# Patient Record
Sex: Male | Born: 1985 | Race: Asian | Hispanic: No | Marital: Single | Smoking: Former smoker
Health system: Southern US, Community
[De-identification: ages and names within clinical notes are randomized; demographics above are authoritative.]

## PROBLEM LIST (undated history)

## (undated) DIAGNOSIS — I499 Cardiac arrhythmia, unspecified: Secondary | ICD-10-CM

## (undated) DIAGNOSIS — K219 Gastro-esophageal reflux disease without esophagitis: Secondary | ICD-10-CM

## (undated) DIAGNOSIS — F419 Anxiety disorder, unspecified: Secondary | ICD-10-CM

## (undated) DIAGNOSIS — T7840XA Allergy, unspecified, initial encounter: Secondary | ICD-10-CM

## (undated) HISTORY — DX: Allergy, unspecified, initial encounter: T78.40XA

## (undated) HISTORY — PX: APPENDECTOMY: SHX54

## (undated) HISTORY — DX: Cardiac arrhythmia, unspecified: I49.9

## (undated) HISTORY — DX: Anxiety disorder, unspecified: F41.9

## (undated) HISTORY — DX: Gastro-esophageal reflux disease without esophagitis: K21.9

## (undated) HISTORY — PX: MANDIBLE FRACTURE SURGERY: SHX706

## (undated) HISTORY — PX: EYE SURGERY: SHX253

---

## 2018-02-02 ENCOUNTER — Encounter: Payer: Self-pay | Admitting: Cardiology

## 2018-12-01 ENCOUNTER — Ambulatory Visit: Payer: Self-pay | Admitting: Nurse Practitioner

## 2018-12-09 ENCOUNTER — Other Ambulatory Visit: Payer: Self-pay | Admitting: *Deleted

## 2018-12-09 DIAGNOSIS — Z20822 Contact with and (suspected) exposure to covid-19: Secondary | ICD-10-CM

## 2018-12-14 ENCOUNTER — Telehealth: Payer: Self-pay | Admitting: General Practice

## 2018-12-14 LAB — NOVEL CORONAVIRUS, NAA: SARS-CoV-2, NAA: NOT DETECTED

## 2018-12-14 NOTE — Telephone Encounter (Signed)
Pt called in to request his results. Advised pt of negative results.

## 2018-12-23 ENCOUNTER — Ambulatory Visit: Payer: Self-pay | Admitting: Nurse Practitioner

## 2018-12-24 ENCOUNTER — Ambulatory Visit (INDEPENDENT_AMBULATORY_CARE_PROVIDER_SITE_OTHER): Payer: Commercial Managed Care - PPO | Admitting: Nurse Practitioner

## 2018-12-24 ENCOUNTER — Other Ambulatory Visit: Payer: Self-pay

## 2018-12-24 ENCOUNTER — Encounter: Payer: Self-pay | Admitting: Nurse Practitioner

## 2018-12-24 VITALS — BP 111/74 | HR 44 | Temp 97.9°F | Ht 71.0 in | Wt 136.0 lb

## 2018-12-24 DIAGNOSIS — K580 Irritable bowel syndrome with diarrhea: Secondary | ICD-10-CM | POA: Diagnosis not present

## 2018-12-24 DIAGNOSIS — F5101 Primary insomnia: Secondary | ICD-10-CM | POA: Diagnosis not present

## 2018-12-24 DIAGNOSIS — L659 Nonscarring hair loss, unspecified: Secondary | ICD-10-CM

## 2018-12-24 DIAGNOSIS — K219 Gastro-esophageal reflux disease without esophagitis: Secondary | ICD-10-CM | POA: Insufficient documentation

## 2018-12-24 DIAGNOSIS — H9211 Otorrhea, right ear: Secondary | ICD-10-CM | POA: Insufficient documentation

## 2018-12-24 DIAGNOSIS — F411 Generalized anxiety disorder: Secondary | ICD-10-CM | POA: Diagnosis not present

## 2018-12-24 DIAGNOSIS — K589 Irritable bowel syndrome without diarrhea: Secondary | ICD-10-CM | POA: Insufficient documentation

## 2018-12-24 DIAGNOSIS — G4700 Insomnia, unspecified: Secondary | ICD-10-CM | POA: Insufficient documentation

## 2018-12-24 MED ORDER — CEFDINIR 300 MG PO CAPS: 300.0000 mg | ORAL_CAPSULE | Freq: Two times a day (BID) | ORAL | 0 refills | Status: AC

## 2018-12-24 MED ORDER — BUSPIRONE HCL 5 MG PO TABS: 5.0000 mg | ORAL_TABLET | Freq: Two times a day (BID) | ORAL | 3 refills | Status: DC | PRN

## 2018-12-24 MED ORDER — FINASTERIDE 5 MG PO TABS: 5.0000 mg | ORAL_TABLET | Freq: Every day | ORAL | 2 refills | Status: DC

## 2018-12-24 MED ORDER — DOXEPIN HCL 3 MG PO TABS: 6.0000 mg | ORAL_TABLET | Freq: Every day | ORAL | 2 refills | Status: DC

## 2018-12-24 NOTE — Assessment & Plan Note (Signed)
Ongoing, treated at this time with Finasteride.  Continue this regimen, refills sent.  Return in 4 weeks for physical and will obtain baseline labs.

## 2018-12-24 NOTE — Assessment & Plan Note (Signed)
Diagnosed in [place] and takes medication from there. Will bring to next visit.  Is aware we may need to use alternate.  Only takes medication as needed.  Declines GI referral at this time.  Labs at physical in 4 weeks, check thyroid.

## 2018-12-24 NOTE — Assessment & Plan Note (Signed)
Ongoing and was prescribed a "nerve pill" in [place]. Will trial Buspar as needed.  Is not interested in daily SSRI.  Return in 4 weeks for follow-up.

## 2018-12-24 NOTE — Assessment & Plan Note (Signed)
Currently using a medication he received in [place], uses as needed.  Is aware for medication in office will need to switch to alternate.  Not interested in GI referral at this time for his IBS and GERD.  Labs in 4 weeks.

## 2018-12-24 NOTE — Progress Notes (Signed)
New Patient Office Visit  Subjective:  Patient ID: Carl Copeland, male    DOB: Feb 08, 1986  Age: 33 y.o. MRN: [id number]  CC:  Chief Complaint  Patient presents with  . Establish Care  . Medication Refill   . This visit was completed via Doximity due to the restrictions of the COVID-19 pandemic. All issues as above were discussed and addressed. Physical exam was done as above through visual confirmation on Doximity. If it was felt that the patient should be evaluated in the office, they were directed there. The patient verbally consented to this visit. . Location of the patient: home . Location of the provider: home . Those involved with this call:  . Provider: Marnee Guarneri, DNP . CMA: Yvonna Alanis, CMA . Front Desk/Registration: Jill Side  . Time spent on call: 30 minutes with patient face to face via video conference. More than 50% of this time was spent in counseling and coordination of care. 10 minutes total spent in review of patient's record and preparation of their chart.  . I verified patient identity using two factors (patient name and date of birth). Patient consents verbally to being seen via telemedicine visit today.    HPI Carl Copeland presents for new patient visit to establish care.  Introduced to Designer, jewellery role and practice setting.  All questions answered.  He is originally from [place] and discussed some medications today he received in [place], I explained to him that some of those are not ones we use here but we can find alternatives.  He reports he has alopecia, takes Finasteride for this, and also history of IBS, insomnia, ear infections, and hyperventilation syndrome (anxiety).  INSOMNIA Takes Doxepin at night sometimes, maybe once or twice a week, for sleep.  When he takes this he takes 6 MG. Duration: chronic Satisfied with sleep quality: yes Difficulty falling asleep: no Difficulty staying asleep: yes Waking a few hours after sleep onset:  sometimes Early morning awakenings: sometimes Daytime hypersomnolence: no Wakes feeling refreshed: yes Good sleep hygiene: yes Apnea: no Snoring: no Depressed/anxious mood: anxious sometimes Recent stress: no Restless legs/nocturnal leg cramps: no Chronic pain/arthritis: no History of sleep study: no Treatments attempted: melatonin   EAR PAIN Diagnosed with ear infection in March.  Was treated with Cefdinir 300 MG.  Reports he is feeling like ear has infection again and wishes to have Cefdinir again, as this helped last time and he had no side effects with this.  Reports he can not take penicillin, but not sure why.  States he has had some drainage from ear and mild pressure. Duration: days Involved ear(s): right Severity:  mild Quality:  none Fever: no Otorrhea: yes Upper respiratory infection symptoms: no Pruritus: no Hearing loss: no Water immersion no Using Q-tips: yes Recurrent otitis media: yes Status: fluctuating Treatments attempted: none   GERD In [place] had been told he has heart burn and IBS  Reports he throws up sometimes and has diarrhea.  States he has irritable bowel, with more diarrhea than constipation.  Was given a pill in [place], which he still has some of, that helps this.  States he does not like to take pills, so he only takes pills that are as needed.  Does not like taking daily pills.  At this time he reports his IBS and GERD are under control.  He has never had colonoscopy or endoscopy.  At this time he reports regular bowel pattern with no diarrhea in past few weeks.  Denies  N&V, fever, constipation, or blood in stool. GERD control status: stable  Satisfied with current treatment? yes Heartburn frequency:  Medication side effects: no  Previous GERD medications: medication from [place] Antacid use frequency:  none Nature: burning Location: epigastric Heartburn duration: 30 minutes Alleviatiating factors: the medication he got in [place] Aggravating  factors: some meals Dysphagia: no Odynophagia:  no Hematemesis: no Blood in stool: no EGD: no   ALOPECIA: Started in his 20's.  Loss of hair to scalp and eyebrows reported.  He takes Finasteride daily for this.  Reports this has slowed down and helped hair loss.  Does not recall having thyroid ever checked.  Does endorses some occasional cold intolerance, fatigue, and anxiety + bowel issues.  Denies weight changes, palpitations, or edema.  ANXIETY/STRESS Saw a psychologist in [place] who diagnosed him with "hyperventilation syndrome".  States they gave him a pill to help the nerves.  Pill bottle states "CLP, G.M., ALMDC".  Discussed with him that certain medications provided in [place] are not provided in the [place].  Educated on medications available, he does not want to take a daily medication such as SSRI, is willing to try Buspar as needed.  Reports he at times gets to breathing fast and that is when he would take medication. Duration:stable Anxious mood: yes  Excessive worrying: no Irritability: sometimes  Sweating: no Nausea: no Palpitations:no Hyperventilation: sometimes, better lately Panic attacks: no Agoraphobia: no  Obscessions/compulsions: no Depressed mood: no Anhedonia: no Weight changes: no Insomnia: yes hard to stay asleep  Hypersomnia: no Fatigue/loss of energy: no Feelings of worthlessness: no Feelings of guilt: no Impaired concentration/indecisiveness: sometimes Suicidal ideations: no  Crying spells: no Recent Stressors/Life Changes: no   Relationship problems: no   Family stress: no     Financial stress: no    Job stress: no    Recent death/loss: no  GAD 7 : Generalized Anxiety Score 12/24/2018  Nervous, Anxious, on Edge 1  Control/stop worrying 1  Worry too much - different things 1  Trouble relaxing 0  Restless 0  Easily annoyed or irritable 1  Afraid - awful might happen 0  Total GAD 7 Score 4  Anxiety Difficulty Not difficult at all    Past Medical  History:  Diagnosis Date  . Allergy   . Cardiac dysrhythmia     Past Surgical History:  Procedure Laterality Date  . APPENDECTOMY    . EYE SURGERY      Family History  Problem Relation Age of Onset  . Heart disease Paternal Grandmother     Social History   Socioeconomic History  . Marital status: Single    Spouse name: Not on file  . Number of children: Not on file  . Years of education: Not on file  . Highest education level: Not on file  Occupational History  . Not on file  Social Needs  . Financial resource strain: Not hard at all  . Food insecurity    Worry: Never true    Inability: Never true  . Transportation needs    Medical: No    Non-medical: No  Tobacco Use  . Smoking status: Former Games developermoker  . Smokeless tobacco: Never Used  Substance and Sexual Activity  . Alcohol use: Yes    Comment: socially  . Drug use: Never  . Sexual activity: Not Currently  Lifestyle  . Physical activity    Days per week: 2 days    Minutes per session: 30 min  . Stress: Only a  little  Relationships  . Social Musicianconnections    Talks on phone: Twice a week    Gets together: Twice a week    Attends religious service: Never    Active member of club or organization: No    Attends meetings of clubs or organizations: Never    Relationship status: Not on file  . Intimate partner violence    Fear of current or ex partner: No    Emotionally abused: No    Physically abused: No    Forced sexual activity: No  Other Topics Concern  . Not on file  Social History Narrative  . Not on file    ROS Review of Systems  Constitutional: Negative for activity change, diaphoresis, fatigue and fever.  HENT: Positive for ear discharge. Negative for congestion, ear pain, postnasal drip, rhinorrhea, sinus pressure, sinus pain, sneezing, sore throat and voice change.   Respiratory: Negative for cough, chest tightness, shortness of breath and wheezing.   Cardiovascular: Negative for chest pain,  palpitations and leg swelling.  Gastrointestinal: Negative for abdominal distention, abdominal pain, constipation, diarrhea, nausea and vomiting.  Endocrine: Negative for cold intolerance and heat intolerance.  Musculoskeletal: Negative.   Skin: Negative.   Neurological: Negative for dizziness, syncope, weakness, light-headedness, numbness and headaches.  Psychiatric/Behavioral: Positive for sleep disturbance. Negative for decreased concentration, self-injury and suicidal ideas. The patient is nervous/anxious.     Objective:   Today's Vitals: BP 111/74   Pulse (!) 44   Temp 97.9 F (36.6 C) (Oral)   Ht 5\' 11"  (1.803 m)   Wt 136 lb (61.7 kg)   SpO2 99%   BMI 18.97 kg/m   Physical Exam Vitals signs and nursing note reviewed.  Constitutional:      General: He is awake. He is not in acute distress.    Appearance: He is well-developed. He is not ill-appearing.  HENT:     Head: Normocephalic.     Comments: Patchy hair pattern to scalp and brows.    Right Ear: Hearing and external ear normal. No drainage.     Left Ear: Hearing and external ear normal. No drainage.     Ears:     Comments: Unable to view inside ear due to virtual visit only.  External ear with no drainage noted and patient denies tenderness to external ear.      Nose: Nose normal.     Mouth/Throat:     Mouth: Mucous membranes are moist.     Pharynx: Oropharynx is clear.     Comments: Viewed via virtual, no erythema noted. Eyes:     General: Lids are normal.        Right eye: No discharge.        Left eye: No discharge.     Conjunctiva/sclera: Conjunctivae normal.  Neck:     Musculoskeletal: Normal range of motion.  Cardiovascular:     Comments: Unable to auscultate due to virtual exam only Pulmonary:     Effort: Pulmonary effort is normal. No accessory muscle usage or respiratory distress.     Comments: Unable to auscultate due to virtual exam only Abdominal:     Tenderness: There is no abdominal tenderness.      Comments: Unable to auscultate due to virtual exam only.  He denies tenderness to abdomen on self palpation.   Neurological:     Mental Status: He is alert and oriented to person, place, and time.  Psychiatric:        Mood and Affect:  Mood normal.        Behavior: Behavior normal. Behavior is cooperative.        Thought Content: Thought content normal.        Judgment: Judgment normal.     Assessment & Plan:   Problem List Items Addressed This Visit      Digestive   Acid reflux    Currently using a medication he received in [place], uses as needed.  Is aware for medication in office will need to switch to alternate.  Not interested in GI referral at this time for his IBS and GERD.  Labs in 4 weeks.      IBS (irritable bowel syndrome)    Diagnosed in [place] and takes medication from there. Will bring to next visit.  Is aware we may need to use alternate.  Only takes medication as needed.  Declines GI referral at this time.  Labs at physical in 4 weeks, check thyroid.         Nervous and Auditory   Drainage from right ear    Acute, mild discomfort reported.  Previously treated successfully with Cefdinir per patient report.  Script sent.  Recommend not using Qtips and may take Tylenol for discomfort if present.  Return to office for worsening or ongoing issues.        Musculoskeletal and Integument   Alopecia - Primary    Ongoing, treated at this time with Finasteride.  Continue this regimen, refills sent.  Return in 4 weeks for physical and will obtain baseline labs.        Other   Insomnia    Chronic, currently treated with Doxepin. Will continue this treatment, at this time he only uses as needed.  Refill sent for 3 MG tablets (he sometimes takes one pill and other times takes two).        Generalized anxiety disorder    Ongoing and was prescribed a "nerve pill" in [place]. Will trial Buspar as needed.  Is not interested in daily SSRI.  Return in 4 weeks for follow-up.       Relevant Medications   busPIRone (BUSPAR) 5 MG tablet      Outpatient Encounter Medications as of 12/24/2018  Medication Sig  . finasteride (PROSCAR) 5 MG tablet Take 1 tablet (5 mg total) by mouth daily.  . [DISCONTINUED] Doxepin HCl 6 MG TABS   . [DISCONTINUED] finasteride (PROSCAR) 5 MG tablet   . busPIRone (BUSPAR) 5 MG tablet Take 1 tablet (5 mg total) by mouth 2 (two) times daily as needed (for anxiety or hyperventilation).  . cefdinir (OMNICEF) 300 MG capsule Take 1 capsule (300 mg total) by mouth 2 (two) times daily for 7 days.  . Doxepin HCl 3 MG TABS Take 2 tablets (6 mg total) by mouth at bedtime.  . [DISCONTINUED] Doxepin HCl 3 MG TABS    No facility-administered encounter medications on file as of 12/24/2018.    I discussed the assessment and treatment plan with the patient. The patient was provided an opportunity to ask questions and all were answered. The patient agreed with the plan and demonstrated an understanding of the instructions.   The patient was advised to call back or seek an in-person evaluation if the symptoms worsen or if the condition fails to improve as anticipated.   I provided 30 minutes of time during this encounter.  Follow-up: Return in about 4 weeks (around 01/21/2019) for Annual physical with labs.   Marjie SkiffJOLENE T CANNADY, NP

## 2018-12-24 NOTE — Patient Instructions (Signed)

## 2018-12-24 NOTE — Assessment & Plan Note (Signed)
Chronic, currently treated with Doxepin. Will continue this treatment, at this time he only uses as needed.  Refill sent for 3 MG tablets (he sometimes takes one pill and other times takes two).

## 2018-12-24 NOTE — Assessment & Plan Note (Signed)
Acute, mild discomfort reported.  Previously treated successfully with Cefdinir per patient report.  Script sent.  Recommend not using Qtips and may take Tylenol for discomfort if present.  Return to office for worsening or ongoing issues.

## 2019-01-15 ENCOUNTER — Other Ambulatory Visit: Payer: Self-pay | Admitting: Nurse Practitioner

## 2019-01-17 ENCOUNTER — Ambulatory Visit: Payer: Commercial Managed Care - PPO | Admitting: Nurse Practitioner

## 2019-01-17 NOTE — Telephone Encounter (Signed)
Requested Prescriptions  Pending Prescriptions Disp Refills  . busPIRone (BUSPAR) 5 MG tablet [Pharmacy Med Name: BUSPIRONE HCL 5 MG TABLET] 180 tablet 0    Sig: TAKE 1 TABLET (5 MG TOTAL) BY MOUTH 2 (TWO) TIMES DAILY AS NEEDED (FOR ANXIETY OR HYPERVENTILATION).     Psychiatry: Anxiolytics/Hypnotics - Non-controlled Passed - 01/15/2019  9:33 AM      Passed - Valid encounter within last 6 months    Recent Outpatient Visits          3 weeks ago La Ward, Barbaraann Faster, NP      Future Appointments            In 1 month Cannady, Barbaraann Faster, NP [facility]

## 2019-02-07 ENCOUNTER — Encounter: Payer: Self-pay | Admitting: Nurse Practitioner

## 2019-02-23 ENCOUNTER — Encounter: Payer: Self-pay | Admitting: Nurse Practitioner

## 2019-02-23 ENCOUNTER — Other Ambulatory Visit: Payer: Self-pay

## 2019-02-23 ENCOUNTER — Ambulatory Visit (INDEPENDENT_AMBULATORY_CARE_PROVIDER_SITE_OTHER): Payer: Commercial Managed Care - PPO | Admitting: Nurse Practitioner

## 2019-02-23 VITALS — BP 98/63 | HR 64 | Temp 97.6°F | Ht 71.0 in | Wt 136.0 lb

## 2019-02-23 DIAGNOSIS — F411 Generalized anxiety disorder: Secondary | ICD-10-CM | POA: Diagnosis not present

## 2019-02-23 DIAGNOSIS — Z114 Encounter for screening for human immunodeficiency virus [HIV]: Secondary | ICD-10-CM

## 2019-02-23 DIAGNOSIS — K219 Gastro-esophageal reflux disease without esophagitis: Secondary | ICD-10-CM | POA: Diagnosis not present

## 2019-02-23 DIAGNOSIS — Z0000 Encounter for general adult medical examination without abnormal findings: Secondary | ICD-10-CM

## 2019-02-23 DIAGNOSIS — I499 Cardiac arrhythmia, unspecified: Secondary | ICD-10-CM | POA: Diagnosis not present

## 2019-02-23 MED ORDER — BUSPIRONE HCL 5 MG PO TABS: 5.0000 mg | ORAL_TABLET | Freq: Two times a day (BID) | ORAL | 3 refills | Status: DC | PRN

## 2019-02-23 MED ORDER — FINASTERIDE 5 MG PO TABS: 5.0000 mg | ORAL_TABLET | Freq: Every day | ORAL | 3 refills | Status: DC

## 2019-02-23 MED ORDER — OMEPRAZOLE 20 MG PO CPDR: 20.0000 mg | DELAYED_RELEASE_CAPSULE | Freq: Every day | ORAL | 3 refills | Status: DC

## 2019-02-23 MED ORDER — DOXEPIN HCL 3 MG PO TABS: 6.0000 mg | ORAL_TABLET | Freq: Every day | ORAL | 3 refills | Status: DC

## 2019-02-23 NOTE — Assessment & Plan Note (Signed)
Diagnosed in [place], abnormal EKG today.  He is asymptomatic.  Have recommended he take Metoprolol consistently.  Referral placed to cardiology for further evaluation and recommendations due to past history, family history, and current EKG.  Baseline labs today, including TSH, CBC, CMP.

## 2019-02-23 NOTE — Patient Instructions (Signed)

## 2019-02-23 NOTE — Assessment & Plan Note (Signed)
Chronic, ongoing.  Tolerating Buspar, will continue this as needed.  Consider SSRI, such as Zoloft, if increased symptoms.  Denies SI/HI

## 2019-02-23 NOTE — Progress Notes (Signed)
BP 98/63   Pulse 64   Temp 97.6 F (36.4 C) (Oral)   Ht 5\' 11"  (1.803 m)   Wt 136 lb (61.7 kg)   SpO2 98%   BMI 18.97 kg/m    Subjective:    Patient ID: Carl Copeland, male    DOB: [birth year], 33 y.o.   MRN: [id number]  HPI: Carl Copeland is a 34 y.o. male presenting on 02/23/2019 for comprehensive medical examination. Current medical complaints include:none  He currently lives with: Interim Problems from his last visit: no   ANXIETY/STRESS Started on Buspar at initial visit for anxiety.  Reported in [place] he had been diagnosed with hyperventilation syndrome and they provided him pills for his nerves.  He does not want to take a daily pill like SSRI, reports he has been doing well with Buspar as needed. Duration:stable Anxious mood: yes  Excessive worrying: no Irritability: no  Sweating: no Nausea: no Palpitations:no Hyperventilation: yes Panic attacks: no Agoraphobia: no  Obscessions/compulsions: no Depressed mood: no Depression screen PHQ 2/9 02/23/2019  Decreased Interest 1  Down, Depressed, Hopeless 1  PHQ - 2 Score 2  Altered sleeping 3  Tired, decreased energy 3  Change in appetite 1  Feeling bad or failure about yourself  2  Trouble concentrating 1  Moving slowly or fidgety/restless 3  Suicidal thoughts 0  PHQ-9 Score 15  Difficult doing work/chores Not difficult at all   Anhedonia: no Weight changes: no Insomnia: yes hard to fall asleep  Hypersomnia: no Fatigue/loss of energy: no Feelings of worthlessness: no Feelings of guilt: no Impaired concentration/indecisiveness: yes Suicidal ideations: no  Crying spells: no Recent Stressors/Life Changes: no   Relationship problems: no   Family stress: no     Financial stress: no    Job stress: no    Recent death/loss: no  GAD 7 : Generalized Anxiety Score 02/23/2019 12/24/2018  Nervous, Anxious, on Edge 1 1  Control/stop worrying 1 1  Worry too much - different things 1 1  Trouble relaxing 1 0  Restless 0  0  Easily annoyed or irritable 1 1  Afraid - awful might happen 1 0  Total GAD 7 Score 6 4  Anxiety Difficulty Not difficult at all Not difficult at all   GERD Has tried over the counter medications, Nexium, and baking soda water.  States Nexium did not work, but baking soda water helps sometimes.  Tried a [place] medication, but this is not working.  States he was told by his cardiologist in [place] that his heart burn may come from his arrhythmia. GERD control status: uncontrolled  Satisfied with current treatment? no Heartburn frequency:  Medication side effects: no  Medication compliance: fluctuating Previous GERD medications: Antacid use frequency:   Duration:  Nature:  Location:  Heartburn duration:  Alleviatiating factors:   Aggravating factors:  Dysphagia: no Odynophagia:  no Hematemesis: no Blood in stool: no EGD: no   IRREGULAR HEART BEAT: He states he is taking a medication that was given to him by a provider in [place] for this.  States his mother's side of the family have arrhythmia, but he does not know what kind as does not talk to mother.  He had a full cardiac work-up in [place] and was told his was a "2/5 and I didn't need surgery yet, but would someday".  Reports he was told if any symptoms with "heart beat to come back to see them".  Denies any symptoms or irregular beats today.  States these  happen more when he is having his "hyperventilation syndrome".  He sent picture of medication prescribed and is Metoprolol ER 1 tablet daily (25 MG).  Reports he does not take this every day as he has been trying to reduce his pills daily, takes "maybe every other day".  Denies SOB, CP, N&V, dizziness, syncope, or headaches.  Does report occasional palpitations, once to twice a week.  Functional Status Survey: Is the patient deaf or have difficulty hearing?: No Does the patient have difficulty seeing, even when wearing glasses/contacts?: No Does the patient have difficulty  concentrating, remembering, or making decisions?: No Does the patient have difficulty walking or climbing stairs?: No Does the patient have difficulty dressing or bathing?: No Does the patient have difficulty doing errands alone such as visiting a doctor's office or shopping?: No  FALL RISK: No flowsheet data found.  Depression Screen Depression screen [facility] 2/9 02/23/2019  Decreased Interest 1  Down, Depressed, Hopeless 1  PHQ - 2 Score 2  Altered sleeping 3  Tired, decreased energy 3  Change in appetite 1  Feeling bad or failure about yourself  2  Trouble concentrating 1  Moving slowly or fidgety/restless 3  Suicidal thoughts 0  PHQ-9 Score 15  Difficult doing work/chores Not difficult at all    Advanced Directives <no information>  Past Medical History:  Past Medical History:  Diagnosis Date  . Allergy   . Cardiac dysrhythmia     Surgical History:  Past Surgical History:  Procedure Laterality Date  . APPENDECTOMY    . EYE SURGERY      Medications:  Current Outpatient Medications on File Prior to Visit  Medication Sig  . metoprolol succinate (TOPROL-XL) 25 MG 24 hr tablet Take 25 mg by mouth daily.   No current facility-administered medications on file prior to visit.     Allergies:  Allergies  Allergen Reactions  . Other     Cherries, pollen, peanuts    . Penicillins Rash    diarrhea    Social History:  Social History   Socioeconomic History  . Marital status: Single    Spouse name: Not on file  . Number of children: Not on file  . Years of education: Not on file  . Highest education level: Not on file  Occupational History  . Not on file  Social Needs  . Financial resource strain: Not hard at all  . Food insecurity    Worry: Never true    Inability: Never true  . Transportation needs    Medical: No    Non-medical: No  Tobacco Use  . Smoking status: Former Research scientist (life sciences)  . Smokeless tobacco: Never Used  Substance and Sexual Activity  . Alcohol  use: Yes    Comment: socially  . Drug use: Never  . Sexual activity: Not Currently  Lifestyle  . Physical activity    Days per week: 2 days    Minutes per session: 30 min  . Stress: Only a little  Relationships  . Social Herbalist on phone: Twice a week    Gets together: Twice a week    Attends religious service: Never    Active member of club or organization: No    Attends meetings of clubs or organizations: Never    Relationship status: Not on file  . Intimate partner violence    Fear of current or ex partner: No    Emotionally abused: No    Physically abused: No  Forced sexual activity: No  Other Topics Concern  . Not on file  Social History Narrative  . Not on file   Social History   Tobacco Use  Smoking Status Former Smoker  Smokeless Tobacco Never Used   Social History   Substance and Sexual Activity  Alcohol Use Yes   Comment: socially    Family History:  Family History  Problem Relation Age of Onset  . Heart disease Paternal Grandmother     Past medical history, surgical history, medications, allergies, family history and social history reviewed with patient today and changes made to appropriate areas of the chart.   Review of Systems - negative All other ROS negative except what is listed above and in the HPI.      Objective:    BP 98/63   Pulse 64   Temp 97.6 F (36.4 C) (Oral)   Ht 5\' 11"  (1.803 m)   Wt 136 lb (61.7 kg)   SpO2 98%   BMI 18.97 kg/m   Wt Readings from Last 3 Encounters:  02/23/19 136 lb (61.7 kg)  12/24/18 136 lb (61.7 kg)    Physical Exam Vitals signs and nursing note reviewed.  Constitutional:      General: He is awake. He is not in acute distress.    Appearance: He is well-developed. He is not ill-appearing.  HENT:     Head: Normocephalic and atraumatic.     Right Ear: Hearing, tympanic membrane, ear canal and external ear normal. No drainage.     Left Ear: Hearing, tympanic membrane, ear canal and  external ear normal. No drainage.     Nose: Nose normal.     Mouth/Throat:     Mouth: Mucous membranes are moist.     Pharynx: Uvula midline.  Eyes:     General: Lids are normal.        Right eye: No discharge.        Left eye: No discharge.     Extraocular Movements: Extraocular movements intact.     Conjunctiva/sclera: Conjunctivae normal.     Pupils: Pupils are equal, round, and reactive to light.     Visual Fields: Right eye visual fields normal and left eye visual fields normal.  Neck:     Musculoskeletal: Normal range of motion and neck supple.     Thyroid: No thyromegaly.     Vascular: No carotid bruit.     Trachea: Trachea normal.  Cardiovascular:     Rate and Rhythm: Normal rate. Rhythm irregularly irregular.     Heart sounds: Normal heart sounds, S1 normal and S2 normal. No murmur. No gallop.   Pulmonary:     Effort: Pulmonary effort is normal. No accessory muscle usage or respiratory distress.     Breath sounds: Normal breath sounds.  Abdominal:     General: Bowel sounds are normal.     Palpations: Abdomen is soft. There is no hepatomegaly or splenomegaly.     Tenderness: There is no abdominal tenderness.  Musculoskeletal: Normal range of motion.     Right lower leg: No edema.     Left lower leg: No edema.  Lymphadenopathy:     Cervical: No cervical adenopathy.  Skin:    General: Skin is warm and dry.     Capillary Refill: Capillary refill takes less than 2 seconds.     Findings: No rash.  Neurological:     Mental Status: He is alert and oriented to person, place, and time.  Cranial Nerves: Cranial nerves are intact.     Gait: Gait is intact.     Deep Tendon Reflexes: Reflexes are normal and symmetric.     Reflex Scores:      Brachioradialis reflexes are 2+ on the right side and 2+ on the left side.      Patellar reflexes are 2+ on the right side and 2+ on the left side. Psychiatric:        Attention and Perception: Attention normal.        Mood and  Affect: Mood normal.        Speech: Speech normal.        Behavior: Behavior normal. Behavior is cooperative.        Thought Content: Thought content normal.        Judgment: Judgment normal.    EKG in office: irregular rhythm with PACs and RBBB, slight elevation T waves V4 and V6, rate 60  Results for orders placed or performed in visit on 12/09/18  Novel Coronavirus, NAA ([facility])  Result Value Ref Range   SARS-CoV-2, NAA Not Detected Not Detected      Assessment & Plan:   Problem List Items Addressed This Visit      Digestive   Acid reflux    Ongoing, no benefit from OTC medication.  Will trial low dose Prilosec and have return in 4 weeks, discussed that this is short term medication and not meant for long term use.  If worsening or continued symptoms consider GI referral.      Relevant Medications   omeprazole (PRILOSEC) 20 MG capsule   Other Relevant Orders   Comprehensive metabolic panel     Other   Generalized anxiety disorder    Chronic, ongoing.  Tolerating Buspar, will continue this as needed.  Consider SSRI, such as Zoloft, if increased symptoms.  Denies SI/HI      Relevant Medications   busPIRone (BUSPAR) 5 MG tablet   Other Relevant Orders   TSH   Irregular heart beat    Diagnosed in [place], abnormal EKG today.  He is asymptomatic.  Have recommended he take Metoprolol consistently.  Referral placed to cardiology for further evaluation and recommendations due to past history, family history, and current EKG.  Baseline labs today, including TSH, CBC, CMP.      Relevant Orders   CBC with Differential/Platelet   Comprehensive metabolic panel   TSH   EKG 12-Lead (Completed)   Ambulatory referral to Cardiology    Other Visit Diagnoses    Encounter for annual physical exam    -  Primary   Relevant Orders   CBC with Differential/Platelet   Comprehensive metabolic panel   TSH   Lipid Panel w/o Chol/HDL Ratio   Encounter for screening for HIV       Relevant  Orders   HIV Antibody (routine testing w rflx)      LABORATORY TESTING:  Health maintenance labs ordered today as discussed above.   IMMUNIZATIONS:   - Tdap: Tetanus vaccination status reviewed: last tetanus booster within 10 years. - Influenza: Administered today - Pneumovax: Not applicable - Prevnar: Not applicable - Zostavax vaccine: Not applicable  SCREENING: - Colonoscopy: Not applicable  Discussed with patient purpose of the colonoscopy is to detect colon cancer at curable precancerous or early stages   - AAA Screening: Not applicable  -Hearing Test: Not applicable  -Spirometry: Not applicable   PATIENT COUNSELING:    Sexuality: Discussed sexually transmitted diseases, partner selection, use of  condoms, avoidance of unintended pregnancy  and contraceptive alternatives.   Advised to avoid cigarette smoking.  I discussed with the patient that most people either abstain from alcohol or drink within safe limits (<=14/week and <=4 drinks/occasion for males, <=7/weeks and <= 3 drinks/occasion for females) and that the risk for alcohol disorders and other health effects rises proportionally with the number of drinks per week and how often a drinker exceeds daily limits.  Discussed cessation/primary prevention of drug use and availability of treatment for abuse.   Diet: Encouraged to adjust caloric intake to maintain  or achieve ideal body weight, to reduce intake of dietary saturated fat and total fat, to limit sodium intake by avoiding high sodium foods and not adding table salt, and to maintain adequate dietary potassium and calcium preferably from fresh fruits, vegetables, and low-fat dairy products.    stressed the importance of regular exercise  Injury prevention: Discussed safety belts, safety helmets, smoke detector, smoking near bedding or upholstery.   Dental health: Discussed importance of regular tooth brushing, flossing, and dental visits.   Follow up plan: NEXT  PREVENTATIVE PHYSICAL DUE IN 1 YEAR. Return in about 4 weeks (around 03/23/2019) for GERD follow-up.

## 2019-02-23 NOTE — Assessment & Plan Note (Signed)
Ongoing, no benefit from OTC medication.  Will trial low dose Prilosec and have return in 4 weeks, discussed that this is short term medication and not meant for long term use.  If worsening or continued symptoms consider GI referral.

## 2019-02-24 LAB — CBC WITH DIFFERENTIAL/PLATELET
Basophils Absolute: 0 10*3/uL (ref 0.0–0.2)
Basos: 1 %
EOS (ABSOLUTE): 0.5 10*3/uL — ABNORMAL HIGH (ref 0.0–0.4)
Eos: 11 %
Hematocrit: 48.3 % (ref 37.5–51.0)
Hemoglobin: 16.3 g/dL (ref 13.0–17.7)
Immature Grans (Abs): 0 10*3/uL (ref 0.0–0.1)
Immature Granulocytes: 0 %
Lymphocytes Absolute: 1.6 10*3/uL (ref 0.7–3.1)
Lymphs: 39 %
MCH: 31.5 pg (ref 26.6–33.0)
MCHC: 33.7 g/dL (ref 31.5–35.7)
MCV: 93 fL (ref 79–97)
Monocytes Absolute: 0.3 10*3/uL (ref 0.1–0.9)
Monocytes: 7 %
Neutrophils Absolute: 1.8 10*3/uL (ref 1.4–7.0)
Neutrophils: 42 %
Platelets: 132 10*3/uL — ABNORMAL LOW (ref 150–450)
RBC: 5.17 x10E6/uL (ref 4.14–5.80)
RDW: 12.7 % (ref 11.6–15.4)
WBC: 4.2 10*3/uL (ref 3.4–10.8)

## 2019-02-24 LAB — COMPREHENSIVE METABOLIC PANEL
ALT: 35 IU/L (ref 0–44)
AST: 42 IU/L — ABNORMAL HIGH (ref 0–40)
Albumin/Globulin Ratio: 2 (ref 1.2–2.2)
Albumin: 4.5 g/dL (ref 4.0–5.0)
Alkaline Phosphatase: 62 IU/L (ref 39–117)
BUN/Creatinine Ratio: 12 (ref 9–20)
BUN: 9 mg/dL (ref 6–20)
Bilirubin Total: 0.6 mg/dL (ref 0.0–1.2)
CO2: 27 mmol/L (ref 20–29)
Calcium: 9.5 mg/dL (ref 8.7–10.2)
Chloride: 105 mmol/L (ref 96–106)
Creatinine, Ser: 0.76 mg/dL (ref 0.76–1.27)
GFR calc Af Amer: 140 mL/min/{1.73_m2} (ref >59)
GFR calc non Af Amer: 121 mL/min/{1.73_m2} (ref >59)
Globulin, Total: 2.2 g/dL (ref 1.5–4.5)
Glucose: 82 mg/dL (ref 65–99)
Potassium: 5.4 mmol/L — ABNORMAL HIGH (ref 3.5–5.2)
Sodium: 142 mmol/L (ref 134–144)
Total Protein: 6.7 g/dL (ref 6.0–8.5)

## 2019-02-24 LAB — TSH: TSH: 1.19 u[IU]/mL (ref 0.450–4.500)

## 2019-02-24 LAB — LIPID PANEL W/O CHOL/HDL RATIO
Cholesterol, Total: 146 mg/dL (ref 100–199)
HDL: 53 mg/dL (ref >39)
LDL Chol Calc (NIH): 75 mg/dL (ref 0–99)
Triglycerides: 96 mg/dL (ref 0–149)
VLDL Cholesterol Cal: 18 mg/dL (ref 5–40)

## 2019-02-24 LAB — HIV ANTIBODY (ROUTINE TESTING W REFLEX): HIV Screen 4th Generation wRfx: NONREACTIVE

## 2019-02-25 ENCOUNTER — Telehealth: Payer: Self-pay | Admitting: Nurse Practitioner

## 2019-02-25 DIAGNOSIS — D696 Thrombocytopenia, unspecified: Secondary | ICD-10-CM | POA: Insufficient documentation

## 2019-02-25 MED ORDER — FINASTERIDE 5 MG PO TABS: 5.0000 mg | ORAL_TABLET | Freq: Every day | ORAL | 3 refills | Status: DC

## 2019-02-25 MED ORDER — DOXEPIN HCL 3 MG PO TABS: 6.0000 mg | ORAL_TABLET | Freq: Every day | ORAL | 3 refills | Status: DC

## 2019-02-25 NOTE — Telephone Encounter (Signed)
Spoke to patient via telephone and reviewed labs.  Discussed normal potassium level and his is mildly elevated.  He does take 3 different vitamins every day, educated him on cutting back on this.  Tapering down to one vitamin and for now taking every other day . He states one is like a Centrum.  Educated on low platelet count, discussed normal range with him.  He denies any bleeding or bruising.  Recommend he avoid products containing ASA.  Discussed that we would continue to monitor this at visits.

## 2019-02-25 NOTE — Telephone Encounter (Signed)
Copied from [place] [id number]. Topic: General - Inquiry >> Feb 24, 2019 12:13 PM Richardo Priest, NT wrote: Reason for CRM: Patient called in returning call from [facility] in regards to labs and hippa. Please advise. >> Feb 24, 2019  4:41 PM Sheran Luz wrote: Patient calling back requesting to speak with [facility].

## 2019-03-01 ENCOUNTER — Ambulatory Visit (INDEPENDENT_AMBULATORY_CARE_PROVIDER_SITE_OTHER): Payer: Commercial Managed Care - PPO | Admitting: Cardiology

## 2019-03-01 ENCOUNTER — Other Ambulatory Visit: Payer: Self-pay

## 2019-03-01 ENCOUNTER — Encounter: Payer: Self-pay | Admitting: Cardiology

## 2019-03-01 VITALS — BP 90/70 | HR 67 | Temp 97.3°F | Ht 71.0 in | Wt 137.8 lb

## 2019-03-01 DIAGNOSIS — R002 Palpitations: Secondary | ICD-10-CM

## 2019-03-01 MED ORDER — METOPROLOL SUCCINATE ER 25 MG PO TB24: 25.0000 mg | ORAL_TABLET | Freq: Every day | ORAL | 1 refills | Status: DC

## 2019-03-01 NOTE — Progress Notes (Signed)
Cardiology Office Note:    Date:  03/01/2019   ID:  Lynelle Doctor, DOB [birth year], MRN [id number]  PCP:  Venita Lick, NP  Cardiologist:  Kate Sable, MD  Electrophysiologist:  None   Referring MD: Venita Lick, NP   Chief Complaint  Patient presents with  . New Patient (Initial Visit)    Irregular heart beat    History of Present Illness:    Carl Copeland is a 33 y.o. male with a hx of anxiety, irregular heartbeats who presents due to history of irregular heartbeats.  Patient states having irregular heartbeats diagnosed sings when he was a child.  He states having palpitations last occurring about 3 weeks ago lasting for about 6 hours total.  During the first 4 hours of the palpitations, he took p.o. metoprolol XL and symptoms resolved 2 hours later.  He has not had any episodes since.  He was evaluated by cardiology in [place] about 1 year ago.  He underwent exercise treadmill test which did not reveal any signs of ischemia, but wide-complex tachycardia was noted along with frequent PACs.  He was then prescribed metoprolol XL 25 mg to take daily.  Patient states not taking the medication every day as prescribed.  He only takes it when he feels he has symptoms.  He states not wanting to be on medications for long because he takes a lot of water pills.  Is a former smoker smoked for about 7 years.  He denies any other history of heart disease.  He denies syncope, but notes shortness of breath.  He was diagnosed with asthma as a kid.  He was monitored in the hospital back in [place] years ago for about 3 days with heart monitor which was unrevealing for his palpitations.  Past Medical History:  Diagnosis Date  . Allergy   . Anxiety   . Cardiac dysrhythmia   . GERD (gastroesophageal reflux disease)     Past Surgical History:  Procedure Laterality Date  . APPENDECTOMY    . EYE SURGERY    . MANDIBLE FRACTURE SURGERY      Current Medications: Current Meds  Medication Sig   . busPIRone (BUSPAR) 5 MG tablet Take 1 tablet (5 mg total) by mouth 2 (two) times daily as needed (for anxiety or hyperventilation).  . Doxepin HCl 3 MG TABS Take 2 tablets (6 mg total) by mouth at bedtime.  . finasteride (PROSCAR) 5 MG tablet Take 1 tablet (5 mg total) by mouth daily.  Marland Kitchen omeprazole (PRILOSEC) 20 MG capsule Take 1 capsule (20 mg total) by mouth daily.     Allergies:   Other and Penicillins   Social History   Socioeconomic History  . Marital status: Single    Spouse name: Not on file  . Number of children: Not on file  . Years of education: Not on file  . Highest education level: Not on file  Occupational History  . Not on file  Social Needs  . Financial resource strain: Not hard at all  . Food insecurity    Worry: Never true    Inability: Never true  . Transportation needs    Medical: No    Non-medical: No  Tobacco Use  . Smoking status: Former Research scientist (life sciences)  . Smokeless tobacco: Never Used  Substance and Sexual Activity  . Alcohol use: Yes    Comment: socially  . Drug use: Never  . Sexual activity: Not Currently  Lifestyle  . Physical activity    Days  per week: 2 days    Minutes per session: 30 min  . Stress: Only a little  Relationships  . Social Musicianconnections    Talks on phone: Twice a week    Gets together: Twice a week    Attends religious service: Never    Active member of club or organization: No    Attends meetings of clubs or organizations: Never    Relationship status: Not on file  Other Topics Concern  . Not on file  Social History Narrative  . Not on file     Family History: The patient's family history includes Heart disease in his paternal grandmother.  ROS:   Please see the history of present illness.     All other systems reviewed and are negative.  EKGs/Labs/Other Studies Reviewed:    The following studies were reviewed today: Echocardiogram dated 02/02/2018 from prior cardiologist LVEF of 50 to 55%, atria normal size, LV normal  wall thickness, RV normal size and function.  EKG:  EKG is  ordered today.  The ekg ordered today demonstrates sinus rhythm, frequent PACs.  Recent Labs: 02/23/2019: ALT 35; BUN 9; Creatinine, Ser 0.76; Hemoglobin 16.3; Platelets 132; Potassium 5.4; Sodium 142; TSH 1.190  Recent Lipid Panel    Component Value Date/Time   CHOL 146 02/23/2019 0900   TRIG 96 02/23/2019 0900   HDL 53 02/23/2019 0900   LDLCALC 75 02/23/2019 0900    Physical Exam:    VS:  BP 90/70 (BP Location: Right Arm, Patient Position: Sitting, Cuff Size: Normal)   Pulse 67   Temp (!) 97.3 F (36.3 C)   Ht 5\' 11"  (1.803 m)   Wt 137 lb 12 oz (62.5 kg)   SpO2 98%   BMI 19.21 kg/m     Wt Readings from Last 3 Encounters:  03/01/19 137 lb 12 oz (62.5 kg)  02/23/19 136 lb (61.7 kg)  12/24/18 136 lb (61.7 kg)     GEN:  Well nourished, well developed in no acute distress HEENT: Normal NECK: No JVD; No carotid bruits LYMPHATICS: No lymphadenopathy CARDIAC: Irregular beat, tachycardic, no murmurs, rubs, gallops RESPIRATORY:  Clear to auscultation without rales, wheezing or rhonchi  ABDOMEN: Soft, non-tender, non-distended MUSCULOSKELETAL:  No edema; No deformity  SKIN: Warm and dry NEUROLOGIC:  Alert and oriented x 3 PSYCHIATRIC:  Normal affect   ASSESSMENT:   Patient with history of palpitations.  Last occurrence was 3 weeks ago.  And prior occurrence was months before that.  In light of his frequent PACs on ECG he is at increased risk for other atrial arrhythmia such as A. Fib.  Echo and exercise treadmill test reviewed by myself did not reveal any evidence for ischemia or cardiac dysfunction.  Wide-complex tachycardia noted after exercise treadmill test.  1. Palpitations    PLAN:    In order of problems listed above:  1. Counseled patient to continue Toprol-XL 25 mg daily as currently prescribed.  We will follow up with him in 3 months to see if he has recurrent symptoms.  Patient does not want to take  medications for long.  If he has no symptoms while on the medication, we will hold medication and see if symptoms recur.  If symptoms recur we will plan on giving him a monitor with duration depending on the frequency of symptoms.  Follow-up in 3 months.  Total encounter time more than 60 minutes  Greater than 50% was spent in counseling and coordination of care with the patient  including reviewing his prior records, explained to the patient premature atrial contractions, atrial fibrillation, the meaning of his prior tests which showed wide-complex tachycardia.  Medication Adjustments/Labs and Tests Ordered: Current medicines are reviewed at length with the patient today.  Concerns regarding medicines are outlined above.  Orders Placed This Encounter  Procedures  . EKG 12-Lead   Meds ordered this encounter  Medications  . metoprolol succinate (TOPROL-XL) 25 MG 24 hr tablet    Sig: Take 1 tablet (25 mg total) by mouth daily.    Dispense:  90 tablet    Refill:  1    Patient Instructions  Medication Instructions:  - Your physician has recommended you make the following change in your medication:   1) Restart toprol (metorprolol succinate) 25 mg- take 1 tablet by mouth once daily  If you need a refill on your cardiac medications before your next appointment, please call your pharmacy.   Lab work: - none ordered  If you have labs (blood work) drawn today and your tests are completely normal, you will receive your results only by: Marland Kitchen MyChart Message (if you have MyChart) OR . A paper copy in the mail If you have any lab test that is abnormal or we need to change your treatment, we will call you to review the results.  Testing/Procedures: - none ordered  Follow-Up: At [facility], you and your health needs are our priority.  As part of our continuing mission to provide you with exceptional heart care, we have created designated Provider Care Teams.  These Care Teams include your  primary Cardiologist (physician) and Advanced Practice Providers (APPs -  Physician Assistants and Nurse Practitioners) who all work together to provide you with the care you need, when you need it. . in 3 months with Dr. Azucena Cecil  Any Other Special Instructions Will Be Listed Below (If Applicable). - N/A      Signed, Debbe Odea, MD  03/01/2019 4:27 PM    [facility]

## 2019-03-01 NOTE — Patient Instructions (Signed)
Medication Instructions:  - Your physician has recommended you make the following change in your medication:   1) Restart toprol (metorprolol succinate) 25 mg- take 1 tablet by mouth once daily  If you need a refill on your cardiac medications before your next appointment, please call your pharmacy.   Lab work: - none ordered  If you have labs (blood work) drawn today and your tests are completely normal, you will receive your results only by: Marland Kitchen MyChart Message (if you have MyChart) OR . A paper copy in the mail If you have any lab test that is abnormal or we need to change your treatment, we will call you to review the results.  Testing/Procedures: - none ordered  Follow-Up: At [facility], you and your health needs are our priority.  As part of our continuing mission to provide you with exceptional heart care, we have created designated Provider Care Teams.  These Care Teams include your primary Cardiologist (physician) and Advanced Practice Providers (APPs -  Physician Assistants and Nurse Practitioners) who all work together to provide you with the care you need, when you need it. . in 3 months with Dr. Garen Lah  Any Other Special Instructions Will Be Listed Below (If Applicable). - N/A

## 2019-03-08 ENCOUNTER — Telehealth: Payer: Self-pay

## 2019-03-08 NOTE — Telephone Encounter (Signed)
Copied from [place] [id number]. Topic: General - Other >> Mar 07, 2019  4:56 PM Rainey Pines A wrote: Patient would like a callback from nurse In regards to canceling the prescription for Doxepin HCl 3 MG TABS   Called and spoke to patient. He states that the pharmacy keeps trying to fill the Doxepin RX and patient states that he has enough at home. He wants [place] to call the pharmacy and tell them to not fill this medication until he needs it. Jolene, are you ok with this?

## 2019-03-08 NOTE — Telephone Encounter (Signed)
I am okay with you calling the pharmacy to notify them of this.  Thank you.

## 2019-03-08 NOTE — Telephone Encounter (Signed)
Pharmacy notified.

## 2019-03-25 ENCOUNTER — Ambulatory Visit: Payer: Commercial Managed Care - PPO | Admitting: Nurse Practitioner

## 2019-03-25 ENCOUNTER — Encounter: Payer: Self-pay | Admitting: Nurse Practitioner

## 2019-03-25 ENCOUNTER — Ambulatory Visit (INDEPENDENT_AMBULATORY_CARE_PROVIDER_SITE_OTHER): Payer: Commercial Managed Care - PPO | Admitting: Nurse Practitioner

## 2019-03-25 ENCOUNTER — Other Ambulatory Visit: Payer: Self-pay

## 2019-03-25 DIAGNOSIS — K219 Gastro-esophageal reflux disease without esophagitis: Secondary | ICD-10-CM | POA: Diagnosis not present

## 2019-03-25 NOTE — Assessment & Plan Note (Signed)
Chronic, improved with Prilosec.  Will continue at this time and plan on checking Mag level next visit.  Return in 6 months.

## 2019-03-25 NOTE — Patient Instructions (Signed)

## 2019-03-25 NOTE — Progress Notes (Signed)
There were no vitals taken for this visit.   Subjective:    Patient ID: Carl Copeland, male    DOB: 12-07-1985, 33 y.o.   MRN: [id number]  HPI: Carl Copeland is a 33 y.o. male  Chief Complaint  Patient presents with  . Gastroesophageal Reflux    4 week f/up- pt satets he is feeling     . This visit was completed via Doximity due to the restrictions of the COVID-19 pandemic. All issues as above were discussed and addressed. Physical exam was done as above through visual confirmation on Doximity. If it was felt that the patient should be evaluated in the office, they were directed there. The patient verbally consented to this visit. . Location of the patient: home . Location of the provider: work . Those involved with this call:  . Provider: Aura Dials, DNP . CMA: Wilhemena Durie, CMA . Front Desk/Registration: Harriet Pho  . Time spent on call: 15 minutes with patient face to face via video conference. More than 50% of this time was spent in counseling and coordination of care. 10 minutes total spent in review of patient's record and preparation of their chart.  . I verified patient identity using two factors (patient name and date of birth). Patient consents verbally to being seen via telemedicine visit today.    GERD Started on Prilosec last visit.  Reports he has no further heart burn and is 100% better with this medication on board.  Denies ADR. GERD control status: stable  Satisfied with current treatment? yes Heartburn frequency: none Medication side effects: no  Medication compliance: good Previous GERD medications: TUMS Alleviatiating factors:  Prilosec Aggravating factors: certain foods Dysphagia: no Odynophagia:  no Hematemesis: no Blood in stool: no EGD: no  Relevant past medical, surgical, family and social history reviewed and updated as indicated. Interim medical history since our last visit reviewed. Allergies and medications reviewed and updated.   Review of Systems  Constitutional: Negative for activity change, diaphoresis, fatigue and fever.  Respiratory: Negative for cough, chest tightness, shortness of breath and wheezing.   Cardiovascular: Negative for chest pain, palpitations and leg swelling.  Gastrointestinal: Negative for abdominal distention, abdominal pain, constipation, diarrhea, nausea and vomiting.  Skin: Negative.     Per HPI unless specifically indicated above     Objective:    There were no vitals taken for this visit.  Wt Readings from Last 3 Encounters:  03/01/19 137 lb 12 oz (62.5 kg)  02/23/19 136 lb (61.7 kg)  12/24/18 136 lb (61.7 kg)    Physical Exam Vitals signs and nursing note reviewed.  Constitutional:      General: He is awake. He is not in acute distress.    Appearance: He is well-developed. He is not ill-appearing.  HENT:     Head: Normocephalic.     Right Ear: Hearing normal. No drainage.     Left Ear: Hearing normal. No drainage.  Eyes:     General: Lids are normal.        Right eye: No discharge.        Left eye: No discharge.     Conjunctiva/sclera: Conjunctivae normal.  Neck:     Musculoskeletal: Normal range of motion.  Pulmonary:     Effort: Pulmonary effort is normal. No accessory muscle usage or respiratory distress.  Neurological:     Mental Status: He is alert and oriented to person, place, and time.  Psychiatric:        Mood  and Affect: Mood normal.        Behavior: Behavior normal. Behavior is cooperative.        Thought Content: Thought content normal.        Judgment: Judgment normal.     Results for orders placed or performed in visit on 02/23/19  HIV Antibody (routine testing w rflx)  Result Value Ref Range   HIV Screen 4th Generation wRfx Non Reactive Non Reactive  CBC with Differential/Platelet  Result Value Ref Range   WBC 4.2 3.4 - 10.8 x10E3/uL   RBC 5.17 4.14 - 5.80 x10E6/uL   Hemoglobin 16.3 13.0 - 17.7 g/dL   Hematocrit 48.3 37.5 - 51.0 %   MCV 93  79 - 97 fL   MCH 31.5 26.6 - 33.0 pg   MCHC 33.7 31.5 - 35.7 g/dL   RDW 12.7 11.6 - 15.4 %   Platelets 132 (L) 150 - 450 x10E3/uL   Neutrophils 42 Not Estab. %   Lymphs 39 Not Estab. %   Monocytes 7 Not Estab. %   Eos 11 Not Estab. %   Basos 1 Not Estab. %   Neutrophils Absolute 1.8 1.4 - 7.0 x10E3/uL   Lymphocytes Absolute 1.6 0.7 - 3.1 x10E3/uL   Monocytes Absolute 0.3 0.1 - 0.9 x10E3/uL   EOS (ABSOLUTE) 0.5 (H) 0.0 - 0.4 x10E3/uL   Basophils Absolute 0.0 0.0 - 0.2 x10E3/uL   Immature Granulocytes 0 Not Estab. %   Immature Grans (Abs) 0.0 0.0 - 0.1 x10E3/uL  Comprehensive metabolic panel  Result Value Ref Range   Glucose 82 65 - 99 mg/dL   BUN 9 6 - 20 mg/dL   Creatinine, Ser 0.76 0.76 - 1.27 mg/dL   GFR calc non Af Amer 121 >59 mL/min/1.73   GFR calc Af Amer 140 >59 mL/min/1.73   BUN/Creatinine Ratio 12 9 - 20   Sodium 142 134 - 144 mmol/L   Potassium 5.4 (H) 3.5 - 5.2 mmol/L   Chloride 105 96 - 106 mmol/L   CO2 27 20 - 29 mmol/L   Calcium 9.5 8.7 - 10.2 mg/dL   Total Protein 6.7 6.0 - 8.5 g/dL   Albumin 4.5 4.0 - 5.0 g/dL   Globulin, Total 2.2 1.5 - 4.5 g/dL   Albumin/Globulin Ratio 2.0 1.2 - 2.2   Bilirubin Total 0.6 0.0 - 1.2 mg/dL   Alkaline Phosphatase 62 39 - 117 IU/L   AST 42 (H) 0 - 40 IU/L   ALT 35 0 - 44 IU/L  TSH  Result Value Ref Range   TSH 1.190 0.450 - 4.500 uIU/mL  Lipid Panel w/o Chol/HDL Ratio  Result Value Ref Range   Cholesterol, Total 146 100 - 199 mg/dL   Triglycerides 96 0 - 149 mg/dL   HDL 53 >39 mg/dL   VLDL Cholesterol Cal 18 5 - 40 mg/dL   LDL Chol Calc (NIH) 75 0 - 99 mg/dL      Assessment & Plan:   Problem List Items Addressed This Visit      Digestive   Acid reflux    Chronic, improved with Prilosec.  Will continue at this time and plan on checking Mag level next visit.  Return in 6 months.         I discussed the assessment and treatment plan with the patient. The patient was provided an opportunity to ask questions and  all were answered. The patient agreed with the plan and demonstrated an understanding of the instructions.   The patient  was advised to call back or seek an in-person evaluation if the symptoms worsen or if the condition fails to improve as anticipated.   I provided 21+ minutes of time during this encounter.  Follow up plan: Return in about 6 months (around 09/23/2019) for GERD, IBS, GAD.

## 2019-03-28 ENCOUNTER — Other Ambulatory Visit: Payer: Self-pay

## 2019-03-31 ENCOUNTER — Encounter: Payer: Self-pay | Admitting: Family Medicine

## 2019-03-31 ENCOUNTER — Ambulatory Visit: Payer: Commercial Managed Care - PPO | Admitting: Nurse Practitioner

## 2019-03-31 ENCOUNTER — Other Ambulatory Visit: Payer: Self-pay

## 2019-03-31 ENCOUNTER — Ambulatory Visit: Payer: Self-pay | Admitting: Family Medicine

## 2019-03-31 ENCOUNTER — Ambulatory Visit: Payer: Commercial Managed Care - PPO | Admitting: Family Medicine

## 2019-03-31 ENCOUNTER — Ambulatory Visit (INDEPENDENT_AMBULATORY_CARE_PROVIDER_SITE_OTHER): Payer: Commercial Managed Care - PPO | Admitting: Family Medicine

## 2019-03-31 VITALS — BP 92/55 | HR 60 | Temp 98.1°F | Ht 71.0 in | Wt 138.0 lb

## 2019-03-31 DIAGNOSIS — M25512 Pain in left shoulder: Secondary | ICD-10-CM

## 2019-03-31 MED ORDER — CYCLOBENZAPRINE HCL 5 MG PO TABS: 5.0000 mg | ORAL_TABLET | Freq: Every evening | ORAL | 0 refills | Status: DC | PRN

## 2019-03-31 NOTE — Patient Instructions (Signed)
Can take 600-800 mg of ibuprofen up to 3 times per day as needed for your shoulder pain.

## 2019-03-31 NOTE — Progress Notes (Signed)
BP (!) 92/55   Pulse 60   Temp 98.1 F (36.7 C) (Oral)   Ht 5\' 11"  (1.803 m)   Wt 138 lb (62.6 kg)   SpO2 98%   BMI 19.25 kg/m    Subjective:    Patient ID: Carl Copeland, male    DOB: 05-30-1986, 33 y.o.   MRN: [id number]  HPI: Carl Copeland is a 33 y.o. male  Chief Complaint  Patient presents with  . Shoulder Pain    left. x a month after working out   1 month of left shoulder pain since starting a shoulder workout at home. Using icy hot, rest, and massage with minimal relief. Pain is sharp and shooting, also down to elbow. Denies swelling, redness, heat. No hx of shoulder injuries in the past.   Relevant past medical, surgical, family and social history reviewed and updated as indicated. Interim medical history since our last visit reviewed. Allergies and medications reviewed and updated.  Review of Systems  Per HPI unless specifically indicated above     Objective:    BP (!) 92/55   Pulse 60   Temp 98.1 F (36.7 C) (Oral)   Ht 5\' 11"  (1.803 m)   Wt 138 lb (62.6 kg)   SpO2 98%   BMI 19.25 kg/m   Wt Readings from Last 3 Encounters:  03/31/19 138 lb (62.6 kg)  03/01/19 137 lb 12 oz (62.5 kg)  02/23/19 136 lb (61.7 kg)    Physical Exam Vitals signs and nursing note reviewed.  Constitutional:      Appearance: Normal appearance.  HENT:     Head: Atraumatic.  Eyes:     Extraocular Movements: Extraocular movements intact.     Conjunctiva/sclera: Conjunctivae normal.  Neck:     Musculoskeletal: Normal range of motion and neck supple.  Cardiovascular:     Rate and Rhythm: Normal rate and regular rhythm.  Pulmonary:     Effort: Pulmonary effort is normal.     Breath sounds: Normal breath sounds.  Musculoskeletal: Normal range of motion.        General: Tenderness (TTP left lateral shoulder ) present. No swelling or deformity.  Skin:    General: Skin is warm and dry.     Findings: No erythema.  Neurological:     General: No focal deficit present.   Mental Status: He is oriented to person, place, and time.     Sensory: No sensory deficit.     Motor: No weakness.     Gait: Gait normal.  Psychiatric:        Mood and Affect: Mood normal.        Thought Content: Thought content normal.        Judgment: Judgment normal.     Results for orders placed or performed in visit on 02/23/19  HIV Antibody (routine testing w rflx)  Result Value Ref Range   HIV Screen 4th Generation wRfx Non Reactive Non Reactive  CBC with Differential/Platelet  Result Value Ref Range   WBC 4.2 3.4 - 10.8 x10E3/uL   RBC 5.17 4.14 - 5.80 x10E6/uL   Hemoglobin 16.3 13.0 - 17.7 g/dL   Hematocrit 48.3 37.5 - 51.0 %   MCV 93 79 - 97 fL   MCH 31.5 26.6 - 33.0 pg   MCHC 33.7 31.5 - 35.7 g/dL   RDW 12.7 11.6 - 15.4 %   Platelets 132 (L) 150 - 450 x10E3/uL   Neutrophils 42 Not Estab. %   Lymphs 39  Not Estab. %   Monocytes 7 Not Estab. %   Eos 11 Not Estab. %   Basos 1 Not Estab. %   Neutrophils Absolute 1.8 1.4 - 7.0 x10E3/uL   Lymphocytes Absolute 1.6 0.7 - 3.1 x10E3/uL   Monocytes Absolute 0.3 0.1 - 0.9 x10E3/uL   EOS (ABSOLUTE) 0.5 (H) 0.0 - 0.4 x10E3/uL   Basophils Absolute 0.0 0.0 - 0.2 x10E3/uL   Immature Granulocytes 0 Not Estab. %   Immature Grans (Abs) 0.0 0.0 - 0.1 x10E3/uL  Comprehensive metabolic panel  Result Value Ref Range   Glucose 82 65 - 99 mg/dL   BUN 9 6 - 20 mg/dL   Creatinine, Ser 9.38 0.76 - 1.27 mg/dL   GFR calc non Af Amer 121 >59 mL/min/1.73   GFR calc Af Amer 140 >59 mL/min/1.73   BUN/Creatinine Ratio 12 9 - 20   Sodium 142 134 - 144 mmol/L   Potassium 5.4 (H) 3.5 - 5.2 mmol/L   Chloride 105 96 - 106 mmol/L   CO2 27 20 - 29 mmol/L   Calcium 9.5 8.7 - 10.2 mg/dL   Total Protein 6.7 6.0 - 8.5 g/dL   Albumin 4.5 4.0 - 5.0 g/dL   Globulin, Total 2.2 1.5 - 4.5 g/dL   Albumin/Globulin Ratio 2.0 1.2 - 2.2   Bilirubin Total 0.6 0.0 - 1.2 mg/dL   Alkaline Phosphatase 62 39 - 117 IU/L   AST 42 (H) 0 - 40 IU/L   ALT 35 0 - 44  IU/L  TSH  Result Value Ref Range   TSH 1.190 0.450 - 4.500 uIU/mL  Lipid Panel w/o Chol/HDL Ratio  Result Value Ref Range   Cholesterol, Total 146 100 - 199 mg/dL   Triglycerides 96 0 - 149 mg/dL   HDL 53 >18 mg/dL   VLDL Cholesterol Cal 18 5 - 40 mg/dL   LDL Chol Calc (NIH) 75 0 - 99 mg/dL      Assessment & Plan:   Problem List Items Addressed This Visit    None    Visit Diagnoses    Acute pain of left shoulder    -  Primary   Suspect muscle strain vs impingement syndrome, likely due to poor warm up, overuse with new workouts. Flexeril, ibuprofen, rest, massage, heat. F/u if no better       Follow up plan: Return if symptoms worsen or fail to improve.

## 2019-04-01 ENCOUNTER — Telehealth: Payer: Self-pay | Admitting: Nurse Practitioner

## 2019-04-01 ENCOUNTER — Other Ambulatory Visit: Payer: Self-pay | Admitting: Nurse Practitioner

## 2019-04-01 NOTE — Telephone Encounter (Signed)
Medication Refill - Medication: cyclobenzaprine (FLEXERIL) 5 MG tablet  Has the patient contacted their pharmacy? Yes - states they did not receive yet (Agent: If no, request that the patient contact the pharmacy for the refill.) (Agent: If yes, when and what did the pharmacy advise?)  Preferred Pharmacy (with phone number or street name):  [id number] [place], [place] [address] [phone] (Phone) [phone] (Fax)   Agent: Please be advised that RX refills may take up to 3 business days. We ask that you follow-up with your pharmacy.

## 2019-04-01 NOTE — Telephone Encounter (Signed)
Noted, thank you

## 2019-04-01 NOTE — Telephone Encounter (Signed)
Called to schedule  6 month f/u and pt stated he would rather call us when time is closer to 6 months.

## 2019-04-01 NOTE — Telephone Encounter (Signed)
Requested medication (s) are due for refill today: unsure  Requested medication (s) are on the active medication list: yes  Last refill:  03/31/2019  Future visit scheduled: no  Notes to clinic: not delegated Patient states pharmacy has not received order.    Requested Prescriptions  Pending Prescriptions Disp Refills   cyclobenzaprine (FLEXERIL) 5 MG tablet 30 tablet 0    Sig: Take 1 tablet (5 mg total) by mouth at bedtime as needed for muscle spasms.     Not Delegated - Analgesics:  Muscle Relaxants Failed - 04/01/2019  3:57 PM      Failed - This refill cannot be delegated      Passed - Valid encounter within last 6 months    Recent Outpatient Visits          Yesterday    [place], [place], [place]   1 week ago Gastroesophageal reflux disease, unspecified whether esophagitis present   [place], Barbaraann Faster, NP   1 month ago Encounter for annual physical exam   [place], Barbaraann Faster, NP   3 months ago [facility], Barbaraann Faster, NP

## 2019-04-04 MED ORDER — CYCLOBENZAPRINE HCL 5 MG PO TABS: 5.0000 mg | ORAL_TABLET | Freq: Every evening | ORAL | 0 refills | Status: DC | PRN

## 2019-04-04 NOTE — Telephone Encounter (Signed)
Called and spoke to patient. Verified that his RX was picked up.

## 2019-04-04 NOTE — Telephone Encounter (Signed)
[facility] due to [facility]. This was NOT relayed on his first call. Pt is upset this is not there, stating he is in a lot of pain. Can you please resend ASAP  cyclobenzaprine (FLEXERIL) 5 MG tablet   [id number] - [place] [place] - [address] [phone] (Phone) [phone] (Fax)   Pt would like call back when this is done.

## 2019-04-14 ENCOUNTER — Telehealth: Payer: Self-pay

## 2019-04-14 DIAGNOSIS — M25512 Pain in left shoulder: Secondary | ICD-10-CM

## 2019-04-14 NOTE — Telephone Encounter (Signed)
For the liver: please alert patient I am not in office today.  I recommend he schedule appointment so we can discuss this further.  Thank you.

## 2019-04-14 NOTE — Telephone Encounter (Signed)
Referral placed.

## 2019-04-14 NOTE — Telephone Encounter (Signed)
Patient offered an appt, states that he is feeling better and that he does not need to be seen now.

## 2019-04-14 NOTE — Telephone Encounter (Signed)
I think at this point we should get him in with orthopedics to see what they recommend regarding physical therapy, imaging etc. I am happy to send in some prednisone in the meantime to see if this helps if he would like

## 2019-04-14 NOTE — Addendum Note (Signed)
Addended by: Merrie Roof E on: 04/14/2019 04:58 PM   Modules accepted: Orders

## 2019-04-14 NOTE — Telephone Encounter (Signed)
He would just like to go see ortho.

## 2019-04-14 NOTE — Telephone Encounter (Signed)
Routing to providers Copied from [place] [phone]. Topic: General - Call Back - No Documentation >> Apr 13, 2019  4:22 PM Erick Blinks wrote: Reason for CRM: requesting call back from PCP. Pt has message for [place] and a separate message for [place].  To Apolonio Schneiders: Please give me a shoulder pill, these current pills have not changed symptoms. Feels the same   To Jolene: Just tested in [place], Ultrasound Liver was swollen years ago. He received medication for it then, but the problem has returned. Pt declined offer to schedule appt.   Requesting call back today   Best contact: [phone] >> Apr 14, 2019  9:16 AM Scherrie Gerlach wrote: Pt calling back to speak with Apolonio Schneiders or Jolene.  Nothing has changed with him, shoulder is still hurting.

## 2019-05-19 ENCOUNTER — Telehealth: Payer: Self-pay | Admitting: *Deleted

## 2019-05-19 ENCOUNTER — Other Ambulatory Visit: Payer: Self-pay | Admitting: Orthopedic Surgery

## 2019-05-19 DIAGNOSIS — M7552 Bursitis of left shoulder: Secondary | ICD-10-CM

## 2019-05-19 DIAGNOSIS — G8929 Other chronic pain: Secondary | ICD-10-CM

## 2019-06-06 ENCOUNTER — Ambulatory Visit
Admission: RE | Admit: 2019-06-06 | Discharge: 2019-06-06 | Disposition: A | Discharge status: Home / Self Care | Payer: Commercial Managed Care - PPO | Source: Ambulatory Visit | Attending: Orthopedic Surgery | Admitting: Orthopedic Surgery

## 2019-06-06 ENCOUNTER — Other Ambulatory Visit: Payer: Self-pay

## 2019-06-06 DIAGNOSIS — M7552 Bursitis of left shoulder: Secondary | ICD-10-CM

## 2019-06-06 DIAGNOSIS — G8929 Other chronic pain: Secondary | ICD-10-CM | POA: Diagnosis present

## 2019-06-06 DIAGNOSIS — M25512 Pain in left shoulder: Secondary | ICD-10-CM | POA: Diagnosis present

## 2019-07-06 ENCOUNTER — Telehealth: Payer: Self-pay | Admitting: Cardiology

## 2019-07-06 NOTE — Telephone Encounter (Signed)
Patient declined follow ups . Deleting recall.

## 2019-07-22 ENCOUNTER — Other Ambulatory Visit: Payer: Self-pay | Admitting: Neurology

## 2019-07-22 DIAGNOSIS — R29898 Other symptoms and signs involving the musculoskeletal system: Secondary | ICD-10-CM

## 2019-07-22 DIAGNOSIS — G7111 Myotonic muscular dystrophy: Secondary | ICD-10-CM

## 2019-08-02 ENCOUNTER — Ambulatory Visit
Admission: RE | Admit: 2019-08-02 | Discharge: 2019-08-02 | Disposition: A | Discharge status: Home / Self Care | Payer: Commercial Managed Care - PPO | Source: Ambulatory Visit | Attending: Neurology | Admitting: Neurology

## 2019-08-02 ENCOUNTER — Other Ambulatory Visit: Payer: Self-pay

## 2019-08-02 DIAGNOSIS — R29898 Other symptoms and signs involving the musculoskeletal system: Secondary | ICD-10-CM

## 2019-08-02 DIAGNOSIS — G7111 Myotonic muscular dystrophy: Secondary | ICD-10-CM

## 2019-08-02 MED ORDER — GADOBUTROL 1 MMOL/ML IV SOLN
6.0000 mL | Freq: Once | INTRAVENOUS | Status: AC | PRN
  Administered 2019-08-02: 6 mL via INTRAVENOUS

## 2019-08-08 ENCOUNTER — Telehealth: Payer: Self-pay | Admitting: Nurse Practitioner

## 2019-08-08 NOTE — Telephone Encounter (Signed)
Please alert patient it is okay to take vaccine with his current medication regimen and I am excited her is getting this tomorrow.

## 2019-08-08 NOTE — Telephone Encounter (Signed)
Copied from CRM [id number]. Topic: General - Other >> Aug 08, 2019  2:27 PM Tamela Oddi wrote: Reason for CRM: Patient wanted to ask the doctor if it is ok for him to get the COVID vaccine with the medication he is currently taking.  He takes the vaccine tomorrow.  Please advise and call patient to let him know.    CB# [phone]

## 2019-08-08 NOTE — Telephone Encounter (Signed)
Called and LVM with patient letting him know what Jolene said.

## 2019-08-24 ENCOUNTER — Encounter: Payer: Self-pay | Admitting: Nurse Practitioner

## 2019-08-24 DIAGNOSIS — G7111 Myotonic muscular dystrophy: Secondary | ICD-10-CM | POA: Insufficient documentation

## 2019-08-29 ENCOUNTER — Other Ambulatory Visit: Payer: Self-pay | Admitting: *Deleted

## 2019-08-29 MED ORDER — METOPROLOL SUCCINATE ER 25 MG PO TB24: 25.0000 mg | ORAL_TABLET | Freq: Every day | ORAL | 0 refills | Status: DC

## 2019-09-09 ENCOUNTER — Other Ambulatory Visit: Payer: Self-pay | Admitting: Nurse Practitioner

## 2019-09-09 MED ORDER — FINASTERIDE 5 MG PO TABS: 5.0000 mg | ORAL_TABLET | Freq: Every day | ORAL | 1 refills | Status: DC

## 2019-09-09 NOTE — Telephone Encounter (Signed)
Patient states he longer needs refills for Doxepin HCl 3 MG TABS, patient states he is able to sleep at night.   Patient requesting finasteride (PROSCAR) 5 MG tablet refill, informed patient please allow 48 to 72 hour turn around  [id number] [place], [place] - [address] Phone:  [phone]  Fax:  [phone]

## 2020-01-08 ENCOUNTER — Emergency Department: Payer: Commercial Managed Care - PPO

## 2020-01-08 ENCOUNTER — Emergency Department
Admission: EM | Admit: 2020-01-08 | Discharge: 2020-01-08 | Disposition: A | Discharge status: Home / Self Care | Payer: Commercial Managed Care - PPO | Attending: Emergency Medicine | Admitting: Emergency Medicine

## 2020-01-08 ENCOUNTER — Encounter: Payer: Self-pay | Admitting: Emergency Medicine

## 2020-01-08 ENCOUNTER — Other Ambulatory Visit: Payer: Self-pay

## 2020-01-08 DIAGNOSIS — S161XXA Strain of muscle, fascia and tendon at neck level, initial encounter: Secondary | ICD-10-CM

## 2020-01-08 DIAGNOSIS — R930 Abnormal findings on diagnostic imaging of skull and head, not elsewhere classified: Secondary | ICD-10-CM | POA: Diagnosis not present

## 2020-01-08 DIAGNOSIS — Z046 Encounter for general psychiatric examination, requested by authority: Secondary | ICD-10-CM | POA: Insufficient documentation

## 2020-01-08 DIAGNOSIS — S39012A Strain of muscle, fascia and tendon of lower back, initial encounter: Secondary | ICD-10-CM

## 2020-01-08 NOTE — Discharge Instructions (Signed)
Please follow up with your primary care provider for symptoms that are not improving over the week.  Return to the ER for symptoms that change or worsen if unable to schedule an appointment. 
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
## 2020-01-08 NOTE — ED Provider Notes (Signed)
MSE was initiated and I personally evaluated the patient and placed orders (if any) at  5:52 PM on January 08, 2020.  The patient appears stable so that the remainder of the MSE may be completed by another provider.   Chinita Pester, FNP 01/08/20 1752    Sharyn Creamer, MD 01/09/20 0006

## 2020-01-08 NOTE — ED Notes (Signed)
Pt states is going to wait outside. Pt updated on wait.

## 2020-01-08 NOTE — ED Notes (Signed)
Reviewed discharge instructions, follow-up care, and prescriptions with patient. Patient verbalized understanding of all information reviewed. Patient stable, with no distress noted at this time.    

## 2020-01-13 ENCOUNTER — Encounter: Payer: Self-pay | Admitting: Nurse Practitioner

## 2020-01-13 ENCOUNTER — Other Ambulatory Visit: Payer: Self-pay

## 2020-01-13 ENCOUNTER — Ambulatory Visit (INDEPENDENT_AMBULATORY_CARE_PROVIDER_SITE_OTHER): Payer: Commercial Managed Care - PPO | Admitting: Nurse Practitioner

## 2020-01-13 VITALS — BP 103/72 | HR 78 | Temp 97.8°F | Ht 71.0 in | Wt 140.0 lb

## 2020-01-13 DIAGNOSIS — S161XXD Strain of muscle, fascia and tendon at neck level, subsequent encounter: Secondary | ICD-10-CM | POA: Insufficient documentation

## 2020-01-13 DIAGNOSIS — S39012D Strain of muscle, fascia and tendon of lower back, subsequent encounter: Secondary | ICD-10-CM | POA: Insufficient documentation

## 2020-01-13 DIAGNOSIS — G7111 Myotonic muscular dystrophy: Secondary | ICD-10-CM | POA: Diagnosis not present

## 2020-01-13 NOTE — Assessment & Plan Note (Addendum)
Acute, improving.  Continue supportive care with light stretching, hydration, Tylenol/ibuprofen, and muscle relaxant.  Will write work note for 1 week - patient to continue working from home during this time and return to clinic next week to monitor improvement.  °

## 2020-01-13 NOTE — Assessment & Plan Note (Addendum)
Acute, improving.  Continue supportive care with light stretching, hydration, Tylenol/ibuprofen, and muscle relaxant.  Will write work note for 1 week - patient to continue working from home during this time and return to clinic next week to monitor improvement.
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
## 2020-01-20 ENCOUNTER — Encounter: Payer: Self-pay | Admitting: Family Medicine

## 2020-01-20 ENCOUNTER — Telehealth (INDEPENDENT_AMBULATORY_CARE_PROVIDER_SITE_OTHER): Payer: Commercial Managed Care - PPO | Admitting: Family Medicine

## 2020-01-20 VITALS — Wt 140.0 lb

## 2020-01-20 DIAGNOSIS — S161XXD Strain of muscle, fascia and tendon at neck level, subsequent encounter: Secondary | ICD-10-CM | POA: Diagnosis not present

## 2020-01-20 DIAGNOSIS — S39012D Strain of muscle, fascia and tendon of lower back, subsequent encounter: Secondary | ICD-10-CM

## 2020-01-20 NOTE — Assessment & Plan Note (Signed)
Continue OTC pain relievers, flexeril prn, stretches, exercises. Chiropractic referral placed for ongoing care, work note given through mid-next week. Re-evaluate if not progressing in recovery 
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
## 2020-01-20 NOTE — Assessment & Plan Note (Signed)
Continue OTC pain relievers, flexeril prn, stretches, exercises. Chiropractic referral placed for ongoing care, work note given through mid-next week. Re-evaluate if not progressing in recovery

## 2020-02-09 ENCOUNTER — Other Ambulatory Visit: Payer: Self-pay | Admitting: Nurse Practitioner

## 2020-03-14 ENCOUNTER — Other Ambulatory Visit: Payer: Self-pay | Admitting: Nurse Practitioner

## 2020-03-14 NOTE — Telephone Encounter (Signed)
Requested Prescriptions  Pending Prescriptions Disp Refills  . busPIRone (BUSPAR) 5 MG tablet [Pharmacy Med Name: BUSPIRONE HCL 5 MG TABLET] 180 tablet 3    Sig: TAKE 1 TABLET (5 MG TOTAL) BY MOUTH 2 (TWO) TIMES DAILY AS NEEDED (FOR ANXIETY OR HYPERVENTILATION).     Psychiatry: Anxiolytics/Hypnotics - Non-controlled Passed - 03/14/2020  1:17 AM      Passed - Valid encounter within last 6 months    Recent Outpatient Visits          1 month ago Lumbar strain, subsequent encounter   [facility] Particia [place]earing, [place]   2 months ago Myotonic dystrophy, type 1 (HCC)   [facility] Valentino Nose, NP   11 months ago Acute pain of left shoulder   [facility] Roosvelt Maser [place], [place]   11 months ago Gastroesophageal reflux disease, unspecified whether esophagitis present   [facility], Dorie Rank, NP   1 year ago Encounter for annual physical exam   [facility] Marjie Skiff, NP

## 2020-04-16 ENCOUNTER — Telehealth: Payer: Self-pay | Admitting: Nurse Practitioner

## 2020-04-16 NOTE — Telephone Encounter (Signed)
Patient called to ask the nurse or doctor to call him regarding his medication for finasteride (PROSCAR) 5 MG tablet.  He stated that he would like a change and wanted some advice.  CB# [phone]

## 2020-04-16 NOTE — Telephone Encounter (Signed)
Called and spoke with patient. He states that he has had trouble getting an erection for the last few months and thinks it may be a side effect from the finasteride medication that he takes. Patient wants to know if this a side effect or if he can be given a prescription for viagra or something to help him get an erection. He states that this is very important to him.

## 2020-04-16 NOTE — Telephone Encounter (Signed)
I would not think this would be related to Finasteride.  I would recommend he reach out to [facility] neurology to ensure not related to his myotonic dystrophy, which it could possibly be related to and if ongoing I recommend office visit to discuss medications for treatment.

## 2020-04-16 NOTE — Telephone Encounter (Signed)
Pt called back, stating that this call back is very important

## 2020-04-16 NOTE — Telephone Encounter (Signed)
Patient notified and verbalized understanding. 

## 2020-04-17 ENCOUNTER — Other Ambulatory Visit: Payer: Self-pay | Admitting: Nurse Practitioner

## 2020-04-17 MED ORDER — SILDENAFIL CITRATE 25 MG PO TABS: 25.0000 mg | ORAL_TABLET | Freq: Every day | ORAL | 0 refills | Status: DC | PRN

## 2020-04-17 NOTE — Telephone Encounter (Signed)
Please see pharmacy request. Pharmacy comment: Alternative Requested:PLEASE SEND AN ALTERNATIVE, NOT COVERED.

## 2020-04-17 NOTE — Telephone Encounter (Signed)
Please see pharmacy request.  Pharmacy comment: Alternative Requested:SILDENAFIL IS NOT COVERED, PLEASE SEND IN A SCRIPT FOR TADALAFIL.

## 2020-04-17 NOTE — Telephone Encounter (Signed)
  Notes to clinic:  Pharmacy comment: Alternative Requested:INSURANCE REQUESTS TADALAFIL.   Requested Prescriptions  Pending Prescriptions Disp Refills   vardenafil (LEVITRA) 5 MG tablet [Pharmacy Med Name: VARDENAFIL HCL 5 MG TABLET] 10 tablet 0      Urology: Erectile Dysfunction Agents Passed - 04/17/2020  1:01 PM      Passed - Last BP in normal range    BP Readings from Last 1 Encounters:  01/13/20 103/72          Passed - Valid encounter within last 12 months    Recent Outpatient Visits           2 months ago Lumbar strain, subsequent encounter   [facility] Roosvelt Maser [place], [place]   3 months ago Myotonic dystrophy, type 1 (HCC)   [facility] Valentino Nose, NP   1 year ago Acute pain of left shoulder   [facility] Particia Nearing, [place]   1 year ago Gastroesophageal reflux disease, unspecified whether esophagitis present   [facility] [place], Dorie Rank, NP   1 year ago Encounter for annual physical exam   [facility] Marjie Skiff, NP       Future Appointments             In 3 weeks Cannady, Dorie Rank, NP [employer], [facility]

## 2020-04-17 NOTE — Telephone Encounter (Signed)
Routing to provider.   See below.

## 2020-04-18 ENCOUNTER — Other Ambulatory Visit: Payer: Self-pay | Admitting: Nurse Practitioner

## 2020-04-18 MED ORDER — SILDENAFIL CITRATE 50 MG PO TABS
50.0000 mg | ORAL_TABLET | Freq: Every day | ORAL | 0 refills | Status: DC | PRN
Start: 2020-04-18 — End: 2020-10-24

## 2020-04-18 MED ORDER — TADALAFIL 10 MG PO TABS: 10.0000 mg | ORAL_TABLET | Freq: Every day | ORAL | 0 refills | Status: DC | PRN

## 2020-04-18 NOTE — Telephone Encounter (Signed)
   Notes to clinic:  : Alternative Requested:SILDENAFIL 50MG  IS PREFERRED BY INSURANCE   Requested Prescriptions  Pending Prescriptions Disp Refills   Vardenafil HCl 10 MG TBDP [Pharmacy Med Name: VARDENAFIL HCL 10 MG ODT] 10 tablet 0      Urology: Erectile Dysfunction Agents Passed - 04/18/2020  8:07 AM      Passed - Last BP in normal range    BP Readings from Last 1 Encounters:  01/13/20 103/72          Passed - Valid encounter within last 12 months    Recent Outpatient Visits           2 months ago Lumbar strain, subsequent encounter   [facility] [place], [place]   3 months ago Myotonic dystrophy, type 1 (HCC)   [facility] [place], NP   1 year ago Acute pain of left shoulder   [facility], Particia Nearing   1 year ago Gastroesophageal reflux disease, unspecified whether esophagitis present   [facility] [place], [place], [place]   1 year ago Encounter for annual physical exam   [facility] Dorie Rank, NP       Future Appointments             In 3 weeks Cannady, Marjie Skiff, NP Dorie Rank, [facility]

## 2020-05-03 ENCOUNTER — Other Ambulatory Visit: Payer: Self-pay | Admitting: Nurse Practitioner

## 2020-05-03 NOTE — Telephone Encounter (Signed)
Requested medications are due for refill today yes  Requested medications are on the active medication list yes  Last refill 9/9  Last visit 03/2019  Future visit scheduled No, canceled  Notes to clinic Pt was to return in October and canceled appt, please assess.

## 2020-05-10 ENCOUNTER — Ambulatory Visit: Payer: Commercial Managed Care - PPO | Admitting: Nurse Practitioner

## 2020-05-13 ENCOUNTER — Other Ambulatory Visit: Payer: Self-pay | Admitting: Nurse Practitioner

## 2020-05-13 NOTE — Telephone Encounter (Signed)
Requested Prescriptions  Pending Prescriptions Disp Refills  . finasteride (PROSCAR) 5 MG tablet [Pharmacy Med Name: FINASTERIDE 5 MG TABLET] 90 tablet 1    Sig: TAKE 1 TABLET BY MOUTH EVERY DAY     Urology: 5-alpha Reductase Inhibitors Passed - 05/13/2020  9:21 AM      Passed - Valid encounter within last 12 months    Recent Outpatient Visits          3 months ago Lumbar strain, subsequent encounter   [facility] Roosvelt Maser [place], [place]   4 months ago Myotonic dystrophy, type 1 (HCC)   [facility] Valentino Nose, NP   1 year ago Acute pain of left shoulder   [facility] Particia Nearing, [place]   1 year ago Gastroesophageal reflux disease, unspecified whether esophagitis present   [facility] [place], Dorie Rank, NP   1 year ago Encounter for annual physical exam   [facility] Marjie Skiff, NP

## 2020-09-07 ENCOUNTER — Other Ambulatory Visit: Payer: Self-pay | Admitting: Nurse Practitioner

## 2020-09-07 NOTE — Telephone Encounter (Signed)
Courtesy refill. Patient needs to make an appointment. Requested Prescriptions  Pending Prescriptions Disp Refills  . busPIRone (BUSPAR) 5 MG tablet [Pharmacy Med Name: BUSPIRONE HCL 5 MG TABLET] 60 tablet 0    Sig: TAKE 1 TABLET (5 MG TOTAL) BY MOUTH 2 (TWO) TIMES DAILY AS NEEDED (FOR ANXIETY OR HYPERVENTILATION).     Psychiatry: Anxiolytics/Hypnotics - Non-controlled Failed - 09/07/2020  1:17 AM      Failed - Valid encounter within last 6 months    Recent Outpatient Visits          7 months ago Lumbar strain, subsequent encounter   [facility] Particia N[place], [place]   7 months ago Myotonic dystrophy, type 1 (HCC)   [facility] Valentino Nose, NP   1 year ago Acute pain of left shoulder   [facility] Roosvelt Maser [place], [place]   1 year ago Gastroesophageal reflux disease, unspecified whether esophagitis present   [facility], Dorie Rank, NP   1 year ago Encounter for annual physical exam   [facility] Marjie Skiff, NP

## 2020-10-06 ENCOUNTER — Other Ambulatory Visit: Payer: Self-pay | Admitting: Nurse Practitioner

## 2020-10-06 NOTE — Telephone Encounter (Signed)
Pt overdue for appt and last RF was a courtesy refill. Sent pt MyChart message to make appointment.  LRF: 09/07/20 #30 no RF Requested Prescriptions  Pending Prescriptions Disp Refills  . busPIRone (BUSPAR) 5 MG tablet [Pharmacy Med Name: BUSPIRONE HCL 5 MG TABLET] 180 tablet 1    Sig: TAKE 1 TABLET (5 MG TOTAL) BY MOUTH 2 (TWO) TIMES DAILY AS NEEDED (FOR ANXIETY OR HYPERVENTILATION).     Psychiatry: Anxiolytics/Hypnotics - Non-controlled Failed - 10/06/2020 11:31 AM      Failed - Valid encounter within last 6 months    Recent Outpatient Visits          8 months ago Lumbar strain, subsequent encounter   [facility] Roosvelt Maser [place], [place]   8 months ago Myotonic dystrophy, type 1 (HCC)   [facility] Valentino Nose, NP   1 year ago Acute pain of left shoulder   [facility] Roosvelt Maser [place], [place]   1 year ago Gastroesophageal reflux disease, unspecified whether esophagitis present   [facility] [place], Dorie Rank, NP   1 year ago Encounter for annual physical exam   [facility] Marjie Skiff, NP

## 2020-10-08 ENCOUNTER — Telehealth: Payer: Self-pay

## 2020-10-08 NOTE — Telephone Encounter (Signed)
Copied from CRM [id number]. Topic: General - Other >> Oct 08, 2020 11:43 AM [name] A wrote: Reason for CRM: Patient has made contact to request that prescription submissions be stopped for them at the moment  The patient does not currently have insurance and is unable to pay the full amount  The patient will have new insurance next month beginning in June and will be able to resume medications then  Please contact to further advise if needed

## 2020-10-08 NOTE — Telephone Encounter (Signed)
Pt verbalized understanding.

## 2020-10-08 NOTE — Telephone Encounter (Signed)
Noted  

## 2020-10-08 NOTE — Telephone Encounter (Signed)
FYI

## 2020-10-08 NOTE — Telephone Encounter (Signed)
FYI pt declined apt and stated he would call when he needs the refill

## 2020-10-08 NOTE — Telephone Encounter (Signed)
Please see Carl Copeland's message 

## 2020-10-16 ENCOUNTER — Ambulatory Visit: Payer: Self-pay | Admitting: *Deleted

## 2020-10-16 NOTE — Telephone Encounter (Signed)
Per initial encounter, "Patient has tested positive for COVID, via home test; Patient is experiencing congestion and a cough; Patient would like to be advised for additional care; Patient has plans to start quarantine today 10/16/20 ; Please contact to further advise when possible"; contacted pt to discuss; the pt says he tested +Covid on a home test on 10/17/20; he was exposed on 10/14/20 and his symptoms started 10/15/20; tier of symptoms reviewed; quarantine guidelines given; recommendations made per nurse triage protocol; he verbalized understanding; the pt is scheduled to see Aura Dials in the office on 10/24/20, he would like to know if this will be changed to a virtual visit since he is just tested positive; he can be contacted at [phone]; will route to office for notification.     Reason for Disposition . [1] HIGH RISK for severe COVID complications (e.g., weak immune system, age > 64 years, obesity with BMI > 25, pregnant, chronic lung disease or other chronic medical condition) AND [2] COVID symptoms (e.g., cough, fever)  (Exceptions: Already seen by PCP and no new or worsening symptoms.)  Answer Assessment - Initial Assessment Questions 1. COVID-19 DIAGNOSIS: "Who made your COVID-19 diagnosis?" "Was it confirmed by a positive lab test or self-test?" If not diagnosed by a doctor (or NP/PA), ask "Are there lots of cases (community spread) where you live?" Note: See public health department website, if unsure.     Home test 10/16/20 2. COVID-19 EXPOSURE: "Was there any known exposure to COVID before the symptoms began?" CDC Definition of close contact: within 6 feet (2 meters) for a total of 15 minutes or more over a 24-hour period.    10/14/20 3. ONSET: "When did the COVID-19 symptoms start?"      5/16/222 4. WORST SYMPTOM: "What is your worst symptom?" (e.g., cough, fever, shortness of breath, muscle aches)     cough 5. COUGH: "Do you have a cough?" If Yes, ask: "How bad is the cough?"        yes 6. FEVER: "Do you have a fever?" If Yes, ask: "What is your temperature, how was it measured, and when did it start?"    no 7. RESPIRATORY STATUS: "Describe your breathing?" (e.g., shortness of breath, wheezing, unable to speak)     no 8. BETTER-SAME-WORSE: "Are you getting better, staying the same or getting worse compared to yesterday?"  If getting worse, ask, "In what way?"  same 9. HIGH RISK DISEASE: "Do you have any chronic medical problems?" (e.g., asthma, heart or lung disease, weak immune system, obesity, etc.)  heart disease, arrythnmia 10. VACCINE: "Have you had the COVID-19 vaccine?" If Yes, ask: "Which one, how many shots, when did you get it?"       Pfizer x 2 11. BOOSTER: "Have you received your COVID-19 booster?" If Yes, ask: "Which one and when did you get it?"       Pfizer x 1 12. PREGNANCY: "Is there any chance you are pregnant?" "When was your last menstrual period?"       n/a 13. OTHER SYMPTOMS: "Do you have any other symptoms?"  (e.g., chills, fatigue, headache, loss of smell or taste, muscle pain, sore throat)       Congestion, sore throat, productive cough with yellow-brown sputum, stuffiness 14. O2 SATURATION MONITOR:  "Do you use an oxygen saturation monitor (pulse oximeter) at home?" If Yes, ask "What is your reading (oxygen level) today?" "What is your usual oxygen saturation reading?" (e.g., 95%)     no  Protocols used: CORONAVIRUS (COVID-19) DIAGNOSED OR SUSPECTED-A-AH

## 2020-10-16 NOTE — Telephone Encounter (Signed)
Spoke to [place] regarding scheduling pt; she will discuss scheduling appt with Aura Dials and they will contact the pt.

## 2020-10-22 ENCOUNTER — Other Ambulatory Visit: Payer: Self-pay | Admitting: Nurse Practitioner

## 2020-10-22 NOTE — Telephone Encounter (Signed)
Patient has appointment 10/24/20- patient was given 2 month refill 2 weeks ago- denied per note with last fill- no 90 day

## 2020-10-24 ENCOUNTER — Telehealth: Payer: Self-pay

## 2020-10-24 ENCOUNTER — Telehealth (INDEPENDENT_AMBULATORY_CARE_PROVIDER_SITE_OTHER): Payer: BC Managed Care – PPO | Admitting: Nurse Practitioner

## 2020-10-24 ENCOUNTER — Encounter: Payer: Self-pay | Admitting: Nurse Practitioner

## 2020-10-24 DIAGNOSIS — G7111 Myotonic muscular dystrophy: Secondary | ICD-10-CM | POA: Diagnosis not present

## 2020-10-24 DIAGNOSIS — Z20822 Contact with and (suspected) exposure to covid-19: Secondary | ICD-10-CM | POA: Diagnosis not present

## 2020-10-24 DIAGNOSIS — F411 Generalized anxiety disorder: Secondary | ICD-10-CM

## 2020-10-24 DIAGNOSIS — U071 COVID-19: Secondary | ICD-10-CM | POA: Diagnosis not present

## 2020-10-24 MED ORDER — OMEPRAZOLE 20 MG PO CPDR: DELAYED_RELEASE_CAPSULE | ORAL | 4 refills | Status: DC

## 2020-10-24 NOTE — Assessment & Plan Note (Signed)
Chronic, stable.  Minimal Buspar use, will continue this as needed.  Consider SSRI, such as Zoloft, if increased symptoms.  Denies SI/HI.  Return in 6 months.

## 2020-10-24 NOTE — Patient Instructions (Signed)

## 2020-10-24 NOTE — Progress Notes (Signed)
There were no vitals taken for this visit.   Subjective:    Patient ID: Carl Copeland, male    DOB: [birth year], 35 y.o.   MRN: [id number]  HPI: Carl Copeland is a 35 y.o. male  Chief Complaint  Patient presents with  . Anxiety    Patient states he has not taken the medication every once a month and states he does not think he needs the medication anymore as he does not use it as often.   . Covid Positive    Patient states he is currently covid positive and is currently in quarantine.     . This visit was completed via video visit through MyChart due to the restrictions of the COVID-19 pandemic. All issues as above were discussed and addressed. Physical exam was done as above through visual confirmation on video through MyChart. If it was felt that the patient should be evaluated in the office, they were directed there. The patient verbally consented to this visit. . Location of the patient: home . Location of the provider: work . Those involved with this call:  . Provider: Aura DialsJolene Alonia Dibuono, DNP . CMA: Malen Gauzeestiny Knight, CMA . Front Desk/Registration: Sherlyn LickBonita Graves  . Time spent on call: 21 minutes with patient face to face via video conference. More than 50% of this time was spent in counseling and coordination of care. 15 minutes total spent in review of patient's record and preparation of their chart.  . I verified patient identity using two factors (patient name and date of birth). Patient consents verbally to being seen via telemedicine visit today.    ANXIETY/STRESS Currently has Buspar but rarely using.  [facility], for myotonic dystrophy which is a stressor for him.  He is requesting a letter today to carry in car, as if gets pulled over by police due to his disease process walking is not always stable and if is requested to do a walk test he is fearful he may not pass due to his condition.  [facility]. Duration:stable Anxious  mood: no  Excessive worrying: no Irritability: no  Sweating: no Nausea: no Palpitations:no Hyperventilation: no Panic attacks: no Agoraphobia: no  Obscessions/compulsions: no Depressed mood: no Depression screen [facility] 2/9 10/24/2020 10/24/2020 02/23/2019  Decreased Interest 0 0 1  Down, Depressed, Hopeless 0 0 1  PHQ - 2 Score 0 0 2  Altered sleeping 0 - 3  Tired, decreased energy 0 - 3  Change in appetite 0 - 1  Feeling bad or failure about yourself  0 - 2  Trouble concentrating 0 - 1  Moving slowly or fidgety/restless 0 - 3  Suicidal thoughts 0 - 0  PHQ-9 Score 0 - 15  Difficult doing work/chores Not difficult at all - Not difficult at all   Anhedonia: no Weight changes: no Insomnia: none Hypersomnia: no Fatigue/loss of energy: no Feelings of worthlessness: no Feelings of guilt: no Impaired concentration/indecisiveness: no Suicidal ideations: no  Crying spells: no Recent Stressors/Life Changes: no   Relationship problems: no   Family stress: no     Financial stress: no    Job stress: no    Recent death/loss: no GAD 7 : Generalized Anxiety Score 10/24/2020 02/23/2019 12/24/2018  Nervous, Anxious, on Edge 0 1 1  Control/stop worrying 0 1 1  Worry too much - different things 0 1 1  Trouble relaxing 0 1 0  Restless 0 0 0  Easily annoyed or irritable  0 1 1  Afraid - awful might happen 0 1 0  Total GAD 7 Score 0 6 4  Anxiety Difficulty Not difficult at all Not difficult at all Not difficult at all   COVID POSITIVE Symptoms started on Monday (May 16th), home test initially negative and then Tuesday tested positive.   He reports still being really tired.   Fever: no Cough: yes Shortness of breath: no Wheezing: no Chest pain: no Chest tightness: no Chest congestion: no Nasal congestion: yes Runny nose: no Post nasal drip: yes Sneezing: no Sore throat: no Swollen glands: no Sinus pressure: no Headache: no Face pain: no Toothache: no Ear pain: none Ear pressure:  none Eyes red/itching:no Eye drainage/crusting: no  Vomiting: no Rash: no Fatigue: yes Sick contacts: yes Strep contacts: no  Context: stable Recurrent sinusitis: no Relief with OTC cold/cough medications: yes  Treatments attempted: anti-histamine   Relevant past medical, surgical, family and social history reviewed and updated as indicated. Interim medical history since our last visit reviewed. Allergies and medications reviewed and updated.  Review of Systems  Constitutional: Positive for fatigue. Negative for activity change, diaphoresis and fever.  HENT: Positive for congestion and postnasal drip. Negative for ear discharge, ear pain, rhinorrhea, sinus pressure, sinus pain and sneezing.   Respiratory: Negative for cough, chest tightness, shortness of breath and wheezing.   Cardiovascular: Negative for chest pain, palpitations and leg swelling.  Gastrointestinal: Negative.   Neurological: Negative.   Psychiatric/Behavioral: Negative.    Per HPI unless specifically indicated above     Objective:    There were no vitals taken for this visit.  Wt Readings from Last 3 Encounters:  01/20/20 140 lb (63.5 kg)  01/13/20 140 lb (63.5 kg)  01/08/20 138 lb 0.1 oz (62.6 kg)    Physical Exam Vitals and nursing note reviewed.  Constitutional:      General: He is awake. He is not in acute distress.    Appearance: He is well-developed. He is not ill-appearing.  HENT:     Head: Normocephalic.     Right Ear: Hearing normal. No drainage.     Left Ear: Hearing normal. No drainage.  Eyes:     General: Lids are normal.        Right eye: No discharge.        Left eye: No discharge.     Conjunctiva/sclera: Conjunctivae normal.  Pulmonary:     Effort: Pulmonary effort is normal. No accessory muscle usage or respiratory distress.  Musculoskeletal:     Cervical back: Normal range of motion.  Neurological:     Mental Status: He is alert and oriented to person, place, and time.   Psychiatric:        Mood and Affect: Mood normal.        Behavior: Behavior normal. Behavior is cooperative.        Thought Content: Thought content normal.        Judgment: Judgment normal.     Results for orders placed or performed in visit on 02/23/19  HIV Antibody (routine testing w rflx)  Result Value Ref Range   HIV Screen 4th Generation wRfx Non Reactive Non Reactive  CBC with Differential/Platelet  Result Value Ref Range   WBC 4.2 3.4 - 10.8 x10E3/uL   RBC 5.17 4.14 - 5.80 x10E6/uL   Hemoglobin 16.3 13.0 - 17.7 g/dL   Hematocrit 37.1 06.2 - 51.0 %   MCV 93 79 - 97 fL   MCH 31.5 26.6 -  33.0 pg   MCHC 33.7 31.5 - 35.7 g/dL   RDW 08.6 57.8 - 46.9 %   Platelets 132 (L) 150 - 450 x10E3/uL   Neutrophils 42 Not Estab. %   Lymphs 39 Not Estab. %   Monocytes 7 Not Estab. %   Eos 11 Not Estab. %   Basos 1 Not Estab. %   Neutrophils Absolute 1.8 1.4 - 7.0 x10E3/uL   Lymphocytes Absolute 1.6 0.7 - 3.1 x10E3/uL   Monocytes Absolute 0.3 0.1 - 0.9 x10E3/uL   EOS (ABSOLUTE) 0.5 (H) 0.0 - 0.4 x10E3/uL   Basophils Absolute 0.0 0.0 - 0.2 x10E3/uL   Immature Granulocytes 0 Not Estab. %   Immature Grans (Abs) 0.0 0.0 - 0.1 x10E3/uL  Comprehensive metabolic panel  Result Value Ref Range   Glucose 82 65 - 99 mg/dL   BUN 9 6 - 20 mg/dL   Creatinine, Ser 6.29 0.76 - 1.27 mg/dL   GFR calc non Af Amer 121 >59 mL/min/1.73   GFR calc Af Amer 140 >59 mL/min/1.73   BUN/Creatinine Ratio 12 9 - 20   Sodium 142 134 - 144 mmol/L   Potassium 5.4 (H) 3.5 - 5.2 mmol/L   Chloride 105 96 - 106 mmol/L   CO2 27 20 - 29 mmol/L   Calcium 9.5 8.7 - 10.2 mg/dL   Total Protein 6.7 6.0 - 8.5 g/dL   Albumin 4.5 4.0 - 5.0 g/dL   Globulin, Total 2.2 1.5 - 4.5 g/dL   Albumin/Globulin Ratio 2.0 1.2 - 2.2   Bilirubin Total 0.6 0.0 - 1.2 mg/dL   Alkaline Phosphatase 62 39 - 117 IU/L   AST 42 (H) 0 - 40 IU/L   ALT 35 0 - 44 IU/L  TSH  Result Value Ref Range   TSH 1.190 0.450 - 4.500 uIU/mL  Lipid Panel  w/o Chol/HDL Ratio  Result Value Ref Range   Cholesterol, Total 146 100 - 199 mg/dL   Triglycerides 96 0 - 149 mg/dL   HDL 53 >52 mg/dL   VLDL Cholesterol Cal 18 5 - 40 mg/dL   LDL Chol Calc (NIH) 75 0 - 99 mg/dL      Assessment & Plan:   Problem List Items Addressed This Visit      Musculoskeletal and Integument   Myotonic dystrophy, type 1 (HCC) - Primary    Chronic, progressive.  At this time continue collaboration with [facility] neurology and orthopedics.  Will provide note to him for vehicle when driving due to difficulty with mobility when walking straight line due to disease process.        Other   Generalized anxiety disorder    Chronic, stable.  Minimal Buspar use, will continue this as needed.  Consider SSRI, such as Zoloft, if increased symptoms.  Denies SI/HI.  Return in 6 months.      Lab test positive for detection of COVID-19 virus    Acute with symptoms >7 days.  At this time is past treatment period for oral regimen or MAB. Recommend continue 10 day quarantine and OTC medications at home for treatment.  Ensure plenty of rest and fluids at home.  Take OTC Claritin for symptom relief.  Return to office for any worsening or ongoing symptoms.         I discussed the assessment and treatment plan with the patient. The patient was provided an opportunity to ask questions and all were answered. The patient agreed with the plan and demonstrated an understanding of the instructions.  The patient was advised to call back or seek an in-person evaluation if the symptoms worsen or if the condition fails to improve as anticipated.   I provided 21+ minutes of time during this encounter.  Follow up plan: Return in about 6 months (around 04/26/2021) for Myotonic Dystrophy and Anxiety + GERD.

## 2020-10-24 NOTE — Assessment & Plan Note (Signed)
Chronic, progressive.  At this time continue collaboration with [facility] neurology and orthopedics.  Will provide note to him for vehicle when driving due to difficulty with mobility when walking straight line due to disease process.

## 2020-10-24 NOTE — Telephone Encounter (Signed)
Called pt to see if he has insurance to add to today's visit and collect co pay no answer left vm

## 2020-10-24 NOTE — Assessment & Plan Note (Signed)
Acute with symptoms >7 days.  At this time is past treatment period for oral regimen or MAB. Recommend continue 10 day quarantine and OTC medications at home for treatment.  Ensure plenty of rest and fluids at home.  Take OTC Claritin for symptom relief.  Return to office for any worsening or ongoing symptoms.

## 2020-11-06 ENCOUNTER — Other Ambulatory Visit: Payer: Self-pay | Admitting: Nurse Practitioner

## 2020-12-06 ENCOUNTER — Other Ambulatory Visit: Payer: Self-pay | Admitting: Nurse Practitioner

## 2020-12-06 MED ORDER — OMEPRAZOLE 20 MG PO CPDR: 20.0000 mg | DELAYED_RELEASE_CAPSULE | Freq: Two times a day (BID) | ORAL | 4 refills | Status: DC

## 2021-01-11 ENCOUNTER — Telehealth: Payer: Self-pay

## 2021-01-11 NOTE — Telephone Encounter (Signed)
Copied from CRM [id number]. Topic: General - Other >> Jan 11, 2021  8:20 AM Jaquita Rector A wrote: Reason for CRM: Patient called in to say that he have some type of stye on his right eye and need for Jolene Cannady to call him back say that he need an Rx sent to the pharmacy please. Can be reached at Ph# [phone]   Routing to provider. Does patient need an appointment? Please route to admin if so.

## 2021-01-11 NOTE — Telephone Encounter (Signed)
Please call and schedule appointment per Jolene. Can be video visit.

## 2021-01-18 DIAGNOSIS — G7111 Myotonic muscular dystrophy: Secondary | ICD-10-CM | POA: Diagnosis not present

## 2021-01-21 DIAGNOSIS — H0012 Chalazion right lower eyelid: Secondary | ICD-10-CM | POA: Diagnosis not present

## 2021-03-06 DIAGNOSIS — H0012 Chalazion right lower eyelid: Secondary | ICD-10-CM | POA: Diagnosis not present

## 2021-03-06 DIAGNOSIS — H0014 Chalazion left upper eyelid: Secondary | ICD-10-CM | POA: Diagnosis not present

## 2021-03-08 ENCOUNTER — Other Ambulatory Visit: Payer: Self-pay | Admitting: Nurse Practitioner

## 2021-03-09 NOTE — Telephone Encounter (Signed)
   Requested medications are on the active medication list yes  Last visit 10/24/20  Future visit scheduled 05/22/21  Notes to clinic insurance will not pay for more than one a day, please assess.

## 2021-03-11 ENCOUNTER — Telehealth: Payer: Self-pay

## 2021-03-11 NOTE — Telephone Encounter (Signed)
PA initiated via CoverMyMeds for Rx Omeprazole 20 mg. Awaiting determination.  KEY: [id number]

## 2021-03-14 DIAGNOSIS — H0012 Chalazion right lower eyelid: Secondary | ICD-10-CM | POA: Diagnosis not present

## 2021-03-14 DIAGNOSIS — H0014 Chalazion left upper eyelid: Secondary | ICD-10-CM | POA: Diagnosis not present

## 2021-03-19 DIAGNOSIS — H0014 Chalazion left upper eyelid: Secondary | ICD-10-CM | POA: Diagnosis not present

## 2021-03-19 DIAGNOSIS — H0012 Chalazion right lower eyelid: Secondary | ICD-10-CM | POA: Diagnosis not present

## 2021-03-26 DIAGNOSIS — H0014 Chalazion left upper eyelid: Secondary | ICD-10-CM | POA: Diagnosis not present

## 2021-03-26 DIAGNOSIS — H0012 Chalazion right lower eyelid: Secondary | ICD-10-CM | POA: Diagnosis not present

## 2021-03-27 MED ORDER — OMEPRAZOLE 20 MG PO CPDR: 20.0000 mg | DELAYED_RELEASE_CAPSULE | Freq: Two times a day (BID) | ORAL | 4 refills | Status: DC

## 2021-04-01 DIAGNOSIS — Z23 Encounter for immunization: Secondary | ICD-10-CM | POA: Diagnosis not present

## 2021-04-08 DIAGNOSIS — H0014 Chalazion left upper eyelid: Secondary | ICD-10-CM | POA: Diagnosis not present

## 2021-04-08 DIAGNOSIS — H0012 Chalazion right lower eyelid: Secondary | ICD-10-CM | POA: Diagnosis not present

## 2021-04-18 DIAGNOSIS — H0014 Chalazion left upper eyelid: Secondary | ICD-10-CM | POA: Diagnosis not present

## 2021-04-18 DIAGNOSIS — H0012 Chalazion right lower eyelid: Secondary | ICD-10-CM | POA: Diagnosis not present

## 2021-04-30 DIAGNOSIS — H0014 Chalazion left upper eyelid: Secondary | ICD-10-CM | POA: Diagnosis not present

## 2021-04-30 DIAGNOSIS — H0012 Chalazion right lower eyelid: Secondary | ICD-10-CM | POA: Diagnosis not present

## 2021-05-20 DIAGNOSIS — H04123 Dry eye syndrome of bilateral lacrimal glands: Secondary | ICD-10-CM | POA: Diagnosis not present

## 2021-05-20 DIAGNOSIS — H0012 Chalazion right lower eyelid: Secondary | ICD-10-CM | POA: Diagnosis not present

## 2021-05-20 DIAGNOSIS — H0102A Squamous blepharitis right eye, upper and lower eyelids: Secondary | ICD-10-CM | POA: Diagnosis not present

## 2021-05-20 DIAGNOSIS — H0014 Chalazion left upper eyelid: Secondary | ICD-10-CM | POA: Diagnosis not present

## 2021-05-22 ENCOUNTER — Encounter: Payer: Self-pay | Admitting: Nurse Practitioner

## 2021-05-22 ENCOUNTER — Ambulatory Visit: Payer: BC Managed Care – PPO | Admitting: Nurse Practitioner

## 2021-05-22 ENCOUNTER — Other Ambulatory Visit: Payer: Self-pay

## 2021-05-22 VITALS — BP 107/74 | HR 83 | Temp 98.4°F | Ht 71.0 in | Wt 142.0 lb

## 2021-05-22 DIAGNOSIS — G7111 Myotonic muscular dystrophy: Secondary | ICD-10-CM | POA: Diagnosis not present

## 2021-05-22 DIAGNOSIS — Z1159 Encounter for screening for other viral diseases: Secondary | ICD-10-CM | POA: Diagnosis not present

## 2021-05-22 DIAGNOSIS — Z136 Encounter for screening for cardiovascular disorders: Secondary | ICD-10-CM

## 2021-05-22 DIAGNOSIS — Z13220 Encounter for screening for lipoid disorders: Secondary | ICD-10-CM | POA: Diagnosis not present

## 2021-05-22 DIAGNOSIS — D696 Thrombocytopenia, unspecified: Secondary | ICD-10-CM

## 2021-05-22 DIAGNOSIS — E559 Vitamin D deficiency, unspecified: Secondary | ICD-10-CM

## 2021-05-22 DIAGNOSIS — Z113 Encounter for screening for infections with a predominantly sexual mode of transmission: Secondary | ICD-10-CM | POA: Diagnosis not present

## 2021-05-22 DIAGNOSIS — K219 Gastro-esophageal reflux disease without esophagitis: Secondary | ICD-10-CM

## 2021-05-22 DIAGNOSIS — F411 Generalized anxiety disorder: Secondary | ICD-10-CM | POA: Diagnosis not present

## 2021-05-22 MED ORDER — OMEPRAZOLE 20 MG PO CPDR: 20.0000 mg | DELAYED_RELEASE_CAPSULE | Freq: Two times a day (BID) | ORAL | 4 refills | Status: DC

## 2021-05-22 MED ORDER — FAMOTIDINE 40 MG PO TABS: 40.0000 mg | ORAL_TABLET | Freq: Every day | ORAL | 4 refills | Status: DC

## 2021-05-22 MED ORDER — FINASTERIDE 5 MG PO TABS: 5.0000 mg | ORAL_TABLET | Freq: Every day | ORAL | 4 refills | Status: DC

## 2021-05-22 MED ORDER — METOPROLOL SUCCINATE ER 25 MG PO TB24: 25.0000 mg | ORAL_TABLET | Freq: Every day | ORAL | 4 refills | Status: DC

## 2021-05-22 NOTE — Assessment & Plan Note (Signed)
Chronic, progressive.  At this time continue collaboration with [facility] neurology and orthopedics.  He is working in trial at [facility], no notes available on chart for this. 

## 2021-05-22 NOTE — Patient Instructions (Signed)
Healthy Eating °Following a healthy eating pattern may help you to achieve and maintain a healthy body weight, reduce the risk of chronic disease, and live a long and productive life. It is important to follow a healthy eating pattern at an appropriate calorie level for your body. Your nutritional needs should be met primarily through food by choosing a variety of nutrient-rich foods. °What are tips for following this plan? °Reading food labels °Read labels and choose the following: °Reduced or low sodium. °Juices with 100% fruit juice. °Foods with low saturated fats and high polyunsaturated and monounsaturated fats. °Foods with whole grains, such as whole wheat, cracked wheat, brown rice, and wild rice. °Whole grains that are fortified with folic acid. This is recommended for women who are pregnant or who want to become pregnant. °Read labels and avoid the following: °Foods with a lot of added sugars. These include foods that contain brown sugar, corn sweetener, corn syrup, dextrose, fructose, glucose, high-fructose corn syrup, honey, invert sugar, lactose, malt syrup, maltose, molasses, raw sugar, sucrose, trehalose, or turbinado sugar. °Do not eat more than the following amounts of added sugar per day: °6 teaspoons (25 g) for women. °9 teaspoons (38 g) for men. °Foods that contain processed or refined starches and grains. °Refined grain products, such as white flour, degermed cornmeal, white bread, and white rice. °Shopping °Choose nutrient-rich snacks, such as vegetables, whole fruits, and nuts. Avoid high-calorie and high-sugar snacks, such as potato chips, fruit snacks, and candy. °Use oil-based dressings and spreads on foods instead of solid fats such as butter, stick margarine, or cream cheese. °Limit pre-made sauces, mixes, and "instant" products such as flavored rice, instant noodles, and ready-made pasta. °Try more plant-protein sources, such as tofu, tempeh, black beans, edamame, lentils, nuts, and  seeds. °Explore eating plans such as the Mediterranean diet or vegetarian diet. °Cooking °Use oil to sauté or stir-fry foods instead of solid fats such as butter, stick margarine, or lard. °Try baking, boiling, grilling, or broiling instead of frying. °Remove the fatty part of meats before cooking. °Steam vegetables in water or broth. °Meal planning ° °At meals, imagine dividing your plate into fourths: °One-half of your plate is fruits and vegetables. °One-fourth of your plate is whole grains. °One-fourth of your plate is protein, especially lean meats, poultry, eggs, tofu, beans, or nuts. °Include low-fat dairy as part of your daily diet. °Lifestyle °Choose healthy options in all settings, including home, work, school, restaurants, or stores. °Prepare your food safely: °Wash your hands after handling raw meats. °Keep food preparation surfaces clean by regularly washing with hot, soapy water. °Keep raw meats separate from ready-to-eat foods, such as fruits and vegetables. °Cook seafood, meat, poultry, and eggs to the recommended internal temperature. °Store foods at safe temperatures. In general: °Keep cold foods at 40°F (4.4°C) or below. °Keep hot foods at 140°F (60°C) or above. °Keep your freezer at 0°F (-17.8°C) or below. °Foods are no longer safe to eat when they have been between the temperatures of 40°-140°F (4.4-60°C) for more than 2 hours. °What foods should I eat? °Fruits °Aim to eat 2 cup-equivalents of fresh, canned (in natural juice), or frozen fruits each day. Examples of 1 cup-equivalent of fruit include 1 small apple, 8 large strawberries, 1 cup canned fruit, ½ cup dried fruit, or 1 cup 100% juice. °Vegetables °Aim to eat 2½-3 cup-equivalents of fresh and frozen vegetables each day, including different varieties and colors. Examples of 1 cup-equivalent of vegetables include 2 medium carrots, 2 cups raw,   leafy greens, 1 cup chopped vegetable (raw or cooked), or 1 medium baked potato. °Grains °Aim to  eat 6 ounce-equivalents of whole grains each day. Examples of 1 ounce-equivalent of grains include 1 slice of bread, 1 cup ready-to-eat cereal, 3 cups popcorn, or ½ cup cooked rice, pasta, or cereal. °Meats and other proteins °Aim to eat 5-6 ounce-equivalents of protein each day. Examples of 1 ounce-equivalent of protein include 1 egg, 1/2 cup nuts or seeds, or 1 tablespoon (16 g) peanut butter. A cut of meat or fish that is the size of a deck of cards is about 3-4 ounce-equivalents. °Of the protein you eat each week, try to have at least 8 ounces come from seafood. This includes salmon, trout, herring, and anchovies. °Dairy °Aim to eat 3 cup-equivalents of fat-free or low-fat dairy each day. Examples of 1 cup-equivalent of dairy include 1 cup (240 mL) milk, 8 ounces (250 g) yogurt, 1½ ounces (44 g) natural cheese, or 1 cup (240 mL) fortified soy milk. °Fats and oils °Aim for about 5 teaspoons (21 g) per day. Choose monounsaturated fats, such as canola and olive oils, avocados, peanut butter, and most nuts, or polyunsaturated fats, such as sunflower, corn, and soybean oils, walnuts, pine nuts, sesame seeds, sunflower seeds, and flaxseed. °Beverages °Aim for six 8-oz glasses of water per day. Limit coffee to three to five 8-oz cups per day. °Limit caffeinated beverages that have added calories, such as soda and energy drinks. °Limit alcohol intake to no more than 1 drink a day for nonpregnant women and 2 drinks a day for men. One drink equals 12 oz of beer (355 mL), 5 oz of wine (148 mL), or 1½ oz of hard liquor (44 mL). °Seasoning and other foods °Avoid adding excess amounts of salt to your foods. Try flavoring foods with herbs and spices instead of salt. °Avoid adding sugar to foods. °Try using oil-based dressings, sauces, and spreads instead of solid fats. °This information is based on general [place] nutrition guidelines. For more information, visit choosemyplate.gov. Exact amounts may vary based on your nutrition  needs. °Summary °A healthy eating plan may help you to maintain a healthy weight, reduce the risk of chronic diseases, and stay active throughout your life. °Plan your meals. Make sure you eat the right portions of a variety of nutrient-rich foods. °Try baking, boiling, grilling, or broiling instead of frying. °Choose healthy options in all settings, including home, work, school, restaurants, or stores. °This information is not intended to replace advice given to you by your health care provider. Make sure you discuss any questions you have with your health care provider. °Document Revised: 01/15/2021 Document Reviewed: 01/15/2021 °Elsevier Patient Education © 2022 [employer]. ° °

## 2021-05-22 NOTE — Assessment & Plan Note (Signed)
Chronic, stable.  Minimal Buspar use, will continue this as needed.  Consider SSRI, such as Zoloft, if increased symptoms.  Denies SI/HI.  Refills up to date.  TSH and CBC today.

## 2021-05-22 NOTE — Assessment & Plan Note (Signed)
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  Continue current regimen and adjust as needed.  Mag level today.  Refills sent.

## 2021-05-22 NOTE — Progress Notes (Signed)
BP 107/74    Pulse 83    Temp 98.4 F (36.9 C)    Ht 5\' 11"  (1.803 m)    Wt 142 lb (64.4 kg)    SpO2 98%    BMI 19.80 kg/m    Subjective:    Patient ID: Carl Copeland, male    DOB: [birth year], 35 y.o.   MRN: [id number]  HPI: Carl Copeland is a 35 y.o. male  Chief Complaint  Patient presents with   Gastroesophageal Reflux   Myotonic Dystrophy   Anxiety   Medication Refill    Patient is requesting a refill on his medications.    STD SCREENING Sexual activity:  In a Monogamous Relationship Contraception: yes Recent unprotected intercourse: yes History of sexually transmitted diseases: no Previous sexually transmitted disease screening: yes Lifetime sexual partners: unknown Genital lesions: no Penile discharge: no Dysuria: no Swollen lymph nodes: no Fevers: no Rash: no   GERD Continues on Pepcid + Omeprazole, which helps with saliva and swallowing.     GERD control status: stable Satisfied with current treatment? yes Heartburn frequency: occasional Medication side effects: no  Medication compliance: stable Dysphagia: no Odynophagia:  no Hematemesis: no Blood in stool: no EGD: yes   ANXIETY/STRESS Currently has Buspar but rarely using.    Is being followed by [facility], originally [facility], for myotonic dystrophy which is a stressor for him.  Saw neurology at [facility] last 01/18/21, plus seeing ophthalmology, last 05/20/21.  Follows with cardiology -- returns in one year.  Currently reports he has transferred to neurology at [facility] due to possible trials.   Duration:stable Anxious mood: no  Excessive worrying: no Irritability: no  Sweating: no Nausea: no Palpitations:no Hyperventilation: no Panic attacks: no Agoraphobia: no  Obscessions/compulsions: no Depressed mood: no Depression screen [facility] 2/9 05/22/2021 10/24/2020 10/24/2020 02/23/2019  Decreased Interest 2 0 0 1  Down, Depressed, Hopeless 2 0 0 1  PHQ - 2 Score 4 0 0 2  Altered sleeping 3 0 - 3  Tired, decreased energy 3 0  - 3  Change in appetite 3 0 - 1  Feeling bad or failure about yourself  1 0 - 2  Trouble concentrating 3 0 - 1  Moving slowly or fidgety/restless 3 0 - 3  Suicidal thoughts 2 0 - 0  PHQ-9 Score 22 0 - 15  Difficult doing work/chores - Not difficult at all - Not difficult at all  Anhedonia: no Weight changes: no Insomnia: none Hypersomnia: no Fatigue/loss of energy: no Feelings of worthlessness: no Feelings of guilt: no Impaired concentration/indecisiveness: no Suicidal ideations: no  Crying spells: no Recent Stressors/Life Changes: no   Relationship problems: no   Family stress: no     Financial stress: no    Job stress: no    Recent death/loss: no GAD 7 : Generalized Anxiety Score 05/22/2021 10/24/2020 02/23/2019 12/24/2018  Nervous, Anxious, on Edge 3 0 1 1  Control/stop worrying 3 0 1 1  Worry too much - different things 3 0 1 1  Trouble relaxing 3 0 1 0  Restless 3 0 0 0  Easily annoyed or irritable 3 0 1 1  Afraid - awful might happen 3 0 1 0  Total GAD 7 Score 21 0 6 4  Anxiety Difficulty Very difficult Not difficult at all Not difficult at all Not difficult at all   Relevant past medical, surgical, family and social history reviewed and updated as indicated. Interim medical history since our last visit reviewed. Allergies and  medications reviewed and updated.  Review of Systems  Constitutional:  Negative for activity change, diaphoresis, fatigue and fever.  Respiratory:  Negative for cough, chest tightness, shortness of breath and wheezing.   Cardiovascular:  Negative for chest pain, palpitations and leg swelling.  Gastrointestinal: Negative.   Neurological: Negative.   Psychiatric/Behavioral: Negative.     Per HPI unless specifically indicated above     Objective:    BP 107/74    Pulse 83    Temp 98.4 F (36.9 C)    Ht 5\' 11"  (1.803 m)    Wt 142 lb (64.4 kg)    SpO2 98%    BMI 19.80 kg/m   Wt Readings from Last 3 Encounters:  05/22/21 142 lb (64.4 kg)   01/20/20 140 lb (63.5 kg)  01/13/20 140 lb (63.5 kg)    Physical Exam Vitals and nursing note reviewed.  Constitutional:      Appearance: He is well-developed.  HENT:     Head: Normocephalic and atraumatic.     Right Ear: Hearing normal. No drainage.     Left Ear: Hearing normal. No drainage.     Mouth/Throat:     Pharynx: Uvula midline.  Eyes:     General: Lids are normal.        Right eye: No discharge.        Left eye: No discharge.     Conjunctiva/sclera: Conjunctivae normal.     Pupils: Pupils are equal, round, and reactive to light.  Neck:     Thyroid: No thyromegaly.     Vascular: No carotid bruit or JVD.     Trachea: Trachea normal.  Cardiovascular:     Rate and Rhythm: Normal rate and regular rhythm.     Heart sounds: Normal heart sounds, S1 normal and S2 normal. No murmur heard.   No gallop.  Pulmonary:     Effort: Pulmonary effort is normal.     Breath sounds: Normal breath sounds.  Abdominal:     General: Bowel sounds are normal.     Palpations: Abdomen is soft. There is no hepatomegaly or splenomegaly.  Musculoskeletal:        General: Normal range of motion.     Cervical back: Normal range of motion and neck supple.     Right lower leg: No edema.     Left lower leg: No edema.  Skin:    General: Skin is warm and dry.     Capillary Refill: Capillary refill takes less than 2 seconds.     Findings: No rash.  Neurological:     Mental Status: He is alert and oriented to person, place, and time.     Deep Tendon Reflexes: Reflexes are normal and symmetric.  Psychiatric:        Mood and Affect: Mood normal.        Behavior: Behavior normal.        Thought Content: Thought content normal.        Judgment: Judgment normal.   Results for orders placed or performed in visit on 02/23/19  HIV Antibody (routine testing w rflx)  Result Value Ref Range   HIV Screen 4th Generation wRfx Non Reactive Non Reactive  CBC with Differential/Platelet  Result Value Ref  Range   WBC 4.2 3.4 - 10.8 x10E3/uL   RBC 5.17 4.14 - 5.80 x10E6/uL   Hemoglobin 16.3 13.0 - 17.7 g/dL   Hematocrit 48.3 37.5 - 51.0 %   MCV 93 79 - 97 fL  MCH 31.5 26.6 - 33.0 pg   MCHC 33.7 31.5 - 35.7 g/dL   RDW 31.5 40.0 - 86.7 %   Platelets 132 (L) 150 - 450 x10E3/uL   Neutrophils 42 Not Estab. %   Lymphs 39 Not Estab. %   Monocytes 7 Not Estab. %   Eos 11 Not Estab. %   Basos 1 Not Estab. %   Neutrophils Absolute 1.8 1.4 - 7.0 x10E3/uL   Lymphocytes Absolute 1.6 0.7 - 3.1 x10E3/uL   Monocytes Absolute 0.3 0.1 - 0.9 x10E3/uL   EOS (ABSOLUTE) 0.5 (H) 0.0 - 0.4 x10E3/uL   Basophils Absolute 0.0 0.0 - 0.2 x10E3/uL   Immature Granulocytes 0 Not Estab. %   Immature Grans (Abs) 0.0 0.0 - 0.1 x10E3/uL  Comprehensive metabolic panel  Result Value Ref Range   Glucose 82 65 - 99 mg/dL   BUN 9 6 - 20 mg/dL   Creatinine, Ser 6.19 0.76 - 1.27 mg/dL   GFR calc non Af Amer 121 >59 mL/min/1.73   GFR calc Af Amer 140 >59 mL/min/1.73   BUN/Creatinine Ratio 12 9 - 20   Sodium 142 134 - 144 mmol/L   Potassium 5.4 (H) 3.5 - 5.2 mmol/L   Chloride 105 96 - 106 mmol/L   CO2 27 20 - 29 mmol/L   Calcium 9.5 8.7 - 10.2 mg/dL   Total Protein 6.7 6.0 - 8.5 g/dL   Albumin 4.5 4.0 - 5.0 g/dL   Globulin, Total 2.2 1.5 - 4.5 g/dL   Albumin/Globulin Ratio 2.0 1.2 - 2.2   Bilirubin Total 0.6 0.0 - 1.2 mg/dL   Alkaline Phosphatase 62 39 - 117 IU/L   AST 42 (H) 0 - 40 IU/L   ALT 35 0 - 44 IU/L  TSH  Result Value Ref Range   TSH 1.190 0.450 - 4.500 uIU/mL  Lipid Panel w/o Chol/HDL Ratio  Result Value Ref Range   Cholesterol, Total 146 100 - 199 mg/dL   Triglycerides 96 0 - 149 mg/dL   HDL 53 >50 mg/dL   VLDL Cholesterol Cal 18 5 - 40 mg/dL   LDL Chol Calc (NIH) 75 0 - 99 mg/dL      Assessment & Plan:   Problem List Items Addressed This Visit       Digestive   Acid reflux    Chronic, stable with current regimen with helps with GERD and his myotonic dystrophy.  Continue current regimen  and adjust as needed.  Mag level today.  Refills sent.      Relevant Medications   famotidine (PEPCID) 40 MG tablet   omeprazole (PRILOSEC) 20 MG capsule   Other Relevant Orders   Magnesium     Musculoskeletal and Integument   Myotonic dystrophy, type 1 (HCC) - Primary    Chronic, progressive.  At this time continue collaboration with [facility] neurology and orthopedics.  He is working in trial at [facility], no notes available on chart for this.      Relevant Orders   VITAMIN D 25 Hydroxy (Vit-D Deficiency, Fractures)   Comprehensive metabolic panel   TSH     Hematopoietic and Hemostatic   Thrombocytopenia (HCC)    Noted on past labs, mild.  Recheck CBC and CMP today.      Relevant Orders   CBC with Differential/Platelet     Other   Generalized anxiety disorder    Chronic, stable.  Minimal Buspar use, will continue this as needed.  Consider SSRI, such as Zoloft, if  increased symptoms.  Denies SI/HI.  Refills up to date.  TSH and CBC today.      Other Visit Diagnoses     Vitamin D deficiency       History of low levels reported, check level today and initiate supplement as needed.   Relevant Orders   VITAMIN D 25 Hydroxy (Vit-D Deficiency, Fractures)   Need for hepatitis C screening test       Hep C screening on labs today, discussed with patient.   Relevant Orders   Hepatitis C antibody   Screen for STD (sexually transmitted disease)       Per patient request, STD screening.   Relevant Orders   HIV Antibody (routine testing w rflx)   RPR   GC/Chlamydia Probe Amp   HSV(herpes simplex vrs) 1+2 ab-IgG   Encounter for lipid screening for cardiovascular disease       Lipid panel on labs today.   Relevant Orders   Lipid Panel w/o Chol/HDL Ratio        Follow up plan: Return in about 1 year (around 05/22/2022) for Annual physical.

## 2021-05-22 NOTE — Assessment & Plan Note (Signed)
Noted on past labs, mild.  Recheck CBC and CMP today. 

## 2021-05-23 ENCOUNTER — Encounter: Payer: Self-pay | Admitting: Nurse Practitioner

## 2021-05-23 LAB — MAGNESIUM: Magnesium: 2.5 mg/dL — ABNORMAL HIGH (ref 1.6–2.3)

## 2021-05-23 LAB — COMPREHENSIVE METABOLIC PANEL
ALT: 39 IU/L (ref 0–44)
AST: 39 IU/L (ref 0–40)
Albumin/Globulin Ratio: 2.1 (ref 1.2–2.2)
Albumin: 4.5 g/dL (ref 4.0–5.0)
Alkaline Phosphatase: 66 IU/L (ref 44–121)
BUN/Creatinine Ratio: 13 (ref 9–20)
BUN: 12 mg/dL (ref 6–20)
Bilirubin Total: 0.6 mg/dL (ref 0.0–1.2)
CO2: 24 mmol/L (ref 20–29)
Calcium: 9.5 mg/dL (ref 8.7–10.2)
Chloride: 106 mmol/L (ref 96–106)
Creatinine, Ser: 0.89 mg/dL (ref 0.76–1.27)
Globulin, Total: 2.1 g/dL (ref 1.5–4.5)
Glucose: 97 mg/dL (ref 70–99)
Potassium: 4.3 mmol/L (ref 3.5–5.2)
Sodium: 142 mmol/L (ref 134–144)
Total Protein: 6.6 g/dL (ref 6.0–8.5)
eGFR: 115 mL/min/{1.73_m2} (ref >59)

## 2021-05-23 LAB — CBC WITH DIFFERENTIAL/PLATELET
Basophils Absolute: 0.1 10*3/uL (ref 0.0–0.2)
Basos: 1 %
EOS (ABSOLUTE): 0.1 10*3/uL (ref 0.0–0.4)
Eos: 3 %
Hematocrit: 45.8 % (ref 37.5–51.0)
Hemoglobin: 16.2 g/dL (ref 13.0–17.7)
Immature Grans (Abs): 0 10*3/uL (ref 0.0–0.1)
Immature Granulocytes: 0 %
Lymphocytes Absolute: 1.6 10*3/uL (ref 0.7–3.1)
Lymphs: 36 %
MCH: 31.6 pg (ref 26.6–33.0)
MCHC: 35.4 g/dL (ref 31.5–35.7)
MCV: 89 fL (ref 79–97)
Monocytes Absolute: 0.4 10*3/uL (ref 0.1–0.9)
Monocytes: 8 %
Neutrophils Absolute: 2.3 10*3/uL (ref 1.4–7.0)
Neutrophils: 52 %
Platelets: 154 10*3/uL (ref 150–450)
RBC: 5.13 x10E6/uL (ref 4.14–5.80)
RDW: 12.2 % (ref 11.6–15.4)
WBC: 4.5 10*3/uL (ref 3.4–10.8)

## 2021-05-23 LAB — HIV ANTIBODY (ROUTINE TESTING W REFLEX): HIV Screen 4th Generation wRfx: NONREACTIVE

## 2021-05-23 LAB — LIPID PANEL W/O CHOL/HDL RATIO
Cholesterol, Total: 166 mg/dL (ref 100–199)
HDL: 47 mg/dL (ref >39)
LDL Chol Calc (NIH): 87 mg/dL (ref 0–99)
Triglycerides: 185 mg/dL — ABNORMAL HIGH (ref 0–149)
VLDL Cholesterol Cal: 32 mg/dL (ref 5–40)

## 2021-05-23 LAB — TSH: TSH: 1.38 u[IU]/mL (ref 0.450–4.500)

## 2021-05-23 LAB — HEPATITIS C ANTIBODY: Hep C Virus Ab: 0.1 s/co ratio (ref 0.0–0.9)

## 2021-05-23 LAB — RPR: RPR Ser Ql: NONREACTIVE

## 2021-05-23 LAB — HSV(HERPES SIMPLEX VRS) I + II AB-IGG
HSV 1 Glycoprotein G Ab, IgG: <0.91 index (ref 0.00–0.90)
HSV 2 IgG, Type Spec: <0.91 index (ref 0.00–0.90)

## 2021-05-23 LAB — VITAMIN D 25 HYDROXY (VIT D DEFICIENCY, FRACTURES): Vit D, 25-Hydroxy: 81 ng/mL (ref 30.0–100.0)

## 2021-05-23 NOTE — Progress Notes (Signed)
Contacted via MyChart   Good evening Carl Copeland, your labs have returned and are overall normal.  Triglycerides were a little elevated, for this I would recommend eating more fish and chicken + more vegetables -- decreased processed foods.  No medications needed for this.  Magnesium mildly elevated, this could be diet related and we will continue to monitor.  So far sexually transmitted disease testing is negative, just waiting on urine test.  Any questions? Keep being stellar!!  Thank you for allowing me to participate in your care.  I appreciate you. Kindest regards, Oracio Galen

## 2021-05-25 LAB — GC/CHLAMYDIA PROBE AMP
Chlamydia trachomatis, NAA: NEGATIVE
Neisseria Gonorrhoeae by PCR: NEGATIVE

## 2021-05-25 NOTE — Progress Notes (Signed)
Contacted via MyChart   Good afternoon Carl Copeland, your urine sexually transmitted disease testing returned negative.  Great news!!

## 2021-07-04 DIAGNOSIS — H0012 Chalazion right lower eyelid: Secondary | ICD-10-CM | POA: Diagnosis not present

## 2021-07-04 DIAGNOSIS — H0014 Chalazion left upper eyelid: Secondary | ICD-10-CM | POA: Diagnosis not present

## 2021-07-04 DIAGNOSIS — H04123 Dry eye syndrome of bilateral lacrimal glands: Secondary | ICD-10-CM | POA: Diagnosis not present

## 2021-07-04 DIAGNOSIS — H0102A Squamous blepharitis right eye, upper and lower eyelids: Secondary | ICD-10-CM | POA: Diagnosis not present

## 2021-07-05 ENCOUNTER — Other Ambulatory Visit: Payer: Self-pay | Admitting: Nurse Practitioner

## 2021-07-05 ENCOUNTER — Encounter: Payer: Self-pay | Admitting: Nurse Practitioner

## 2021-07-05 MED ORDER — SILDENAFIL CITRATE 50 MG PO TABS: 50.0000 mg | ORAL_TABLET | Freq: Every day | ORAL | 4 refills | Status: DC | PRN

## 2021-11-10 IMAGING — MR MR SHOULDER*L* W/O CM
5 series · 32 of 40 positions shown · non-contrast
Comparison: None.

CLINICAL DATA: Left shoulder pain after injury February 2019

EXAM:
MRI OF THE LEFT SHOULDER WITHOUT CONTRAST
TECHNIQUE: Multiplanar, multisequence MR imaging of the shoulder was performed.
No intravenous contrast was administered.

[Series 5: PD fat-sat · axial · left · 4.0mm · 0.55mm/px · z∈[-46,+83]mm · 8 of 28 slices shown (1 of 2)]
[im 1/28]
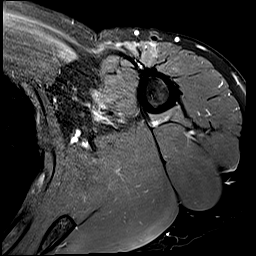
[im 4/28]
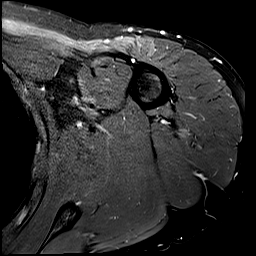
[im 10/28]
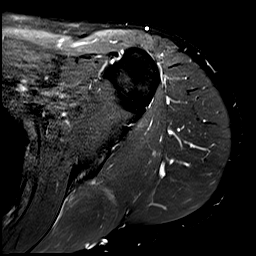
[im 13/28]
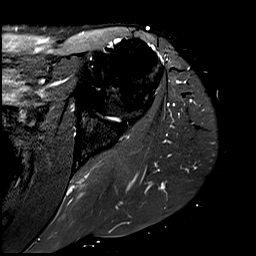
[im 16/28]
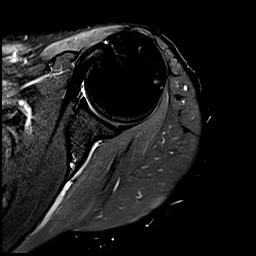
[im 19/28]
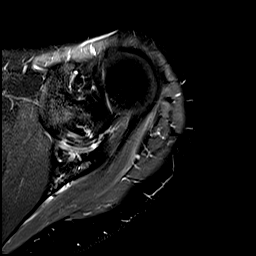
[im 25/28]
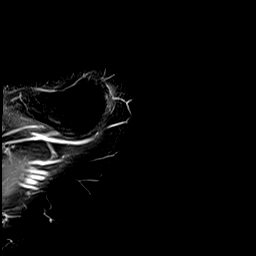
[im 28/28]
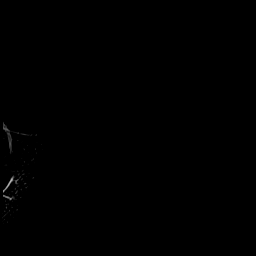

[Series 6: PD fat-sat · oblique · left · 4.0mm · 0.44mm/px · 8 of 26 slices shown (2 of 2)]
[im 1/26]
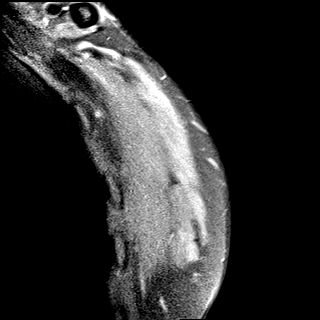
[im 4/26]
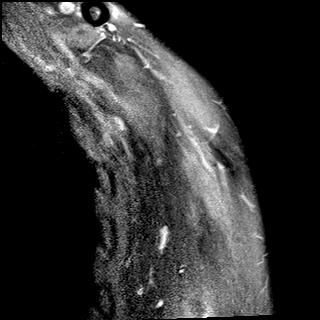
[im 8/26]
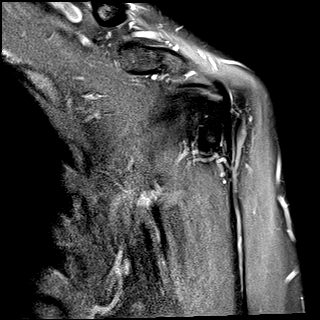
[im 11/26]
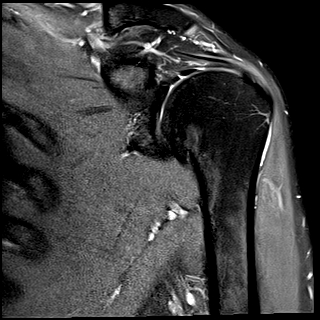
[im 15/26]
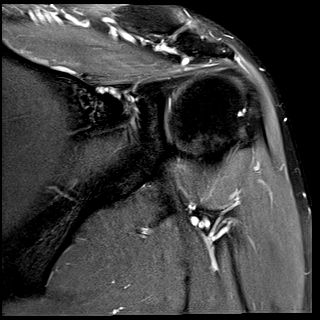
[im 18/26]
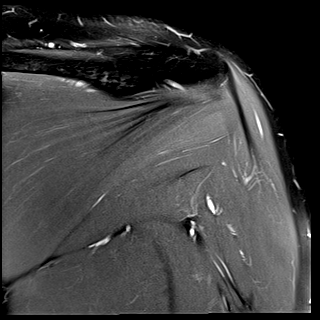
[im 22/26]
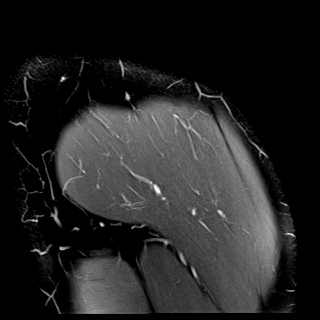
[im 26/26]
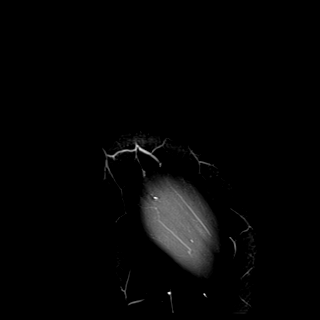

[Series 7: T2 fat-sat · oblique · left · 4.0mm · 0.44mm/px · 8 of 26 slices shown (1 of 2)]
[im 1/26]
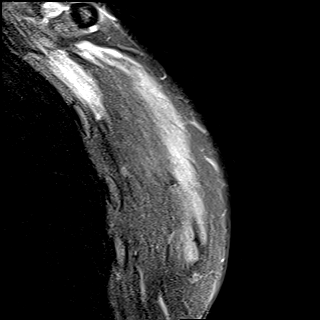
[im 4/26]
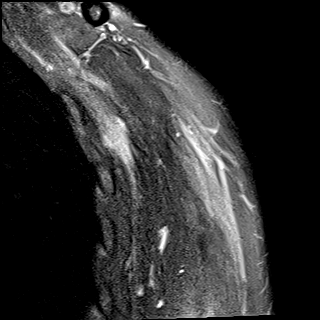
[im 8/26]
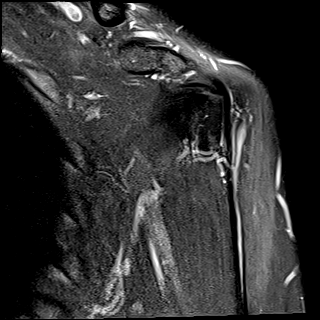
[im 11/26]
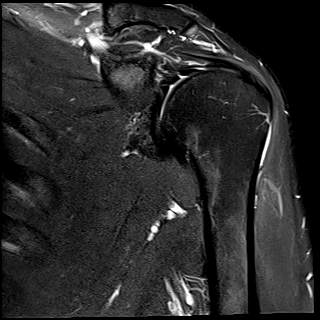
[im 15/26]
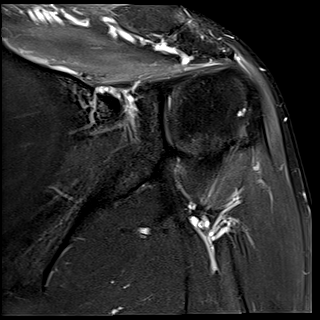
[im 18/26]
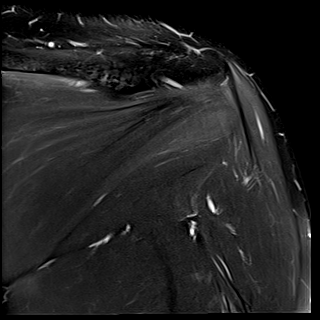
[im 22/26]
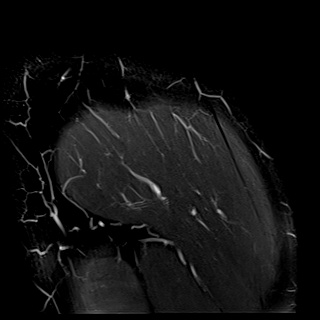
[im 26/26]
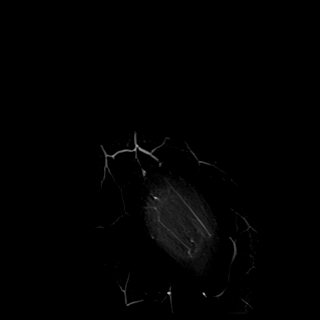

[Series 8: T2 fat-sat · oblique · left · 4.0mm · 0.23mm/px · 7 of 22 slices shown (2 of 2)]
[im 1/22]
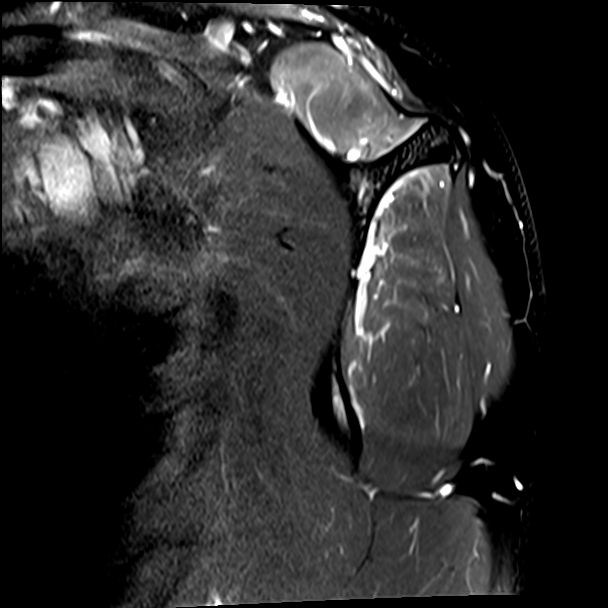
[im 4/22]
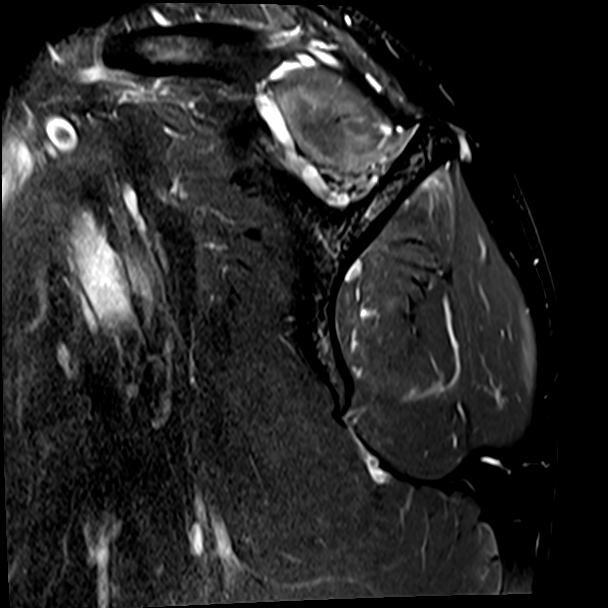
[im 8/22]
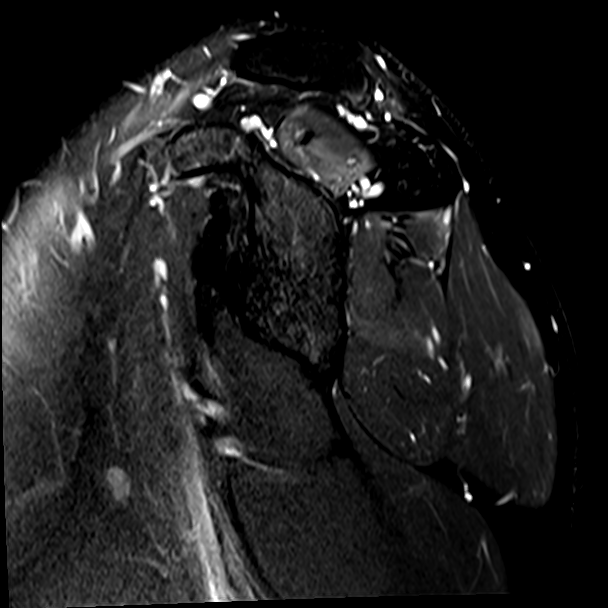
[im 11/22]
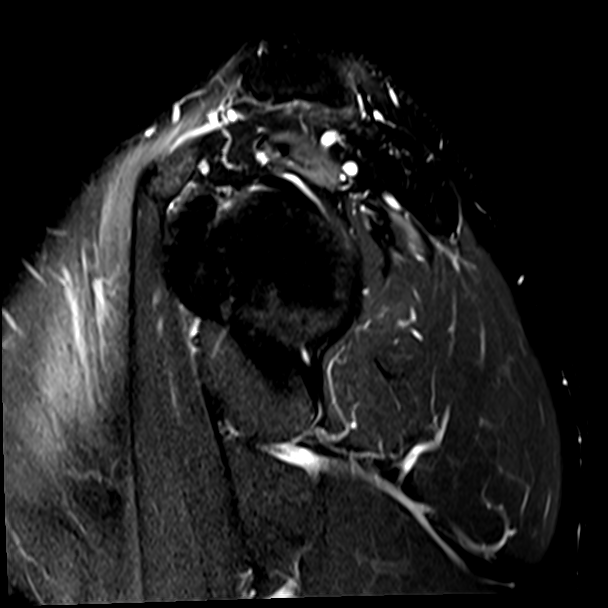
[im 15/22]
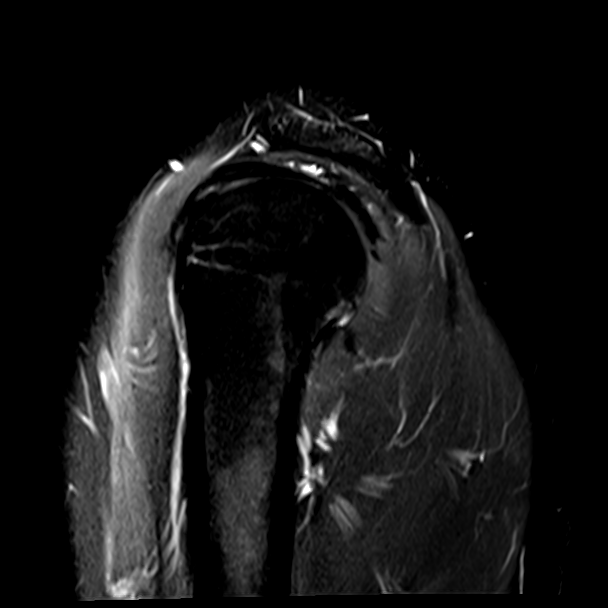
[im 18/22]
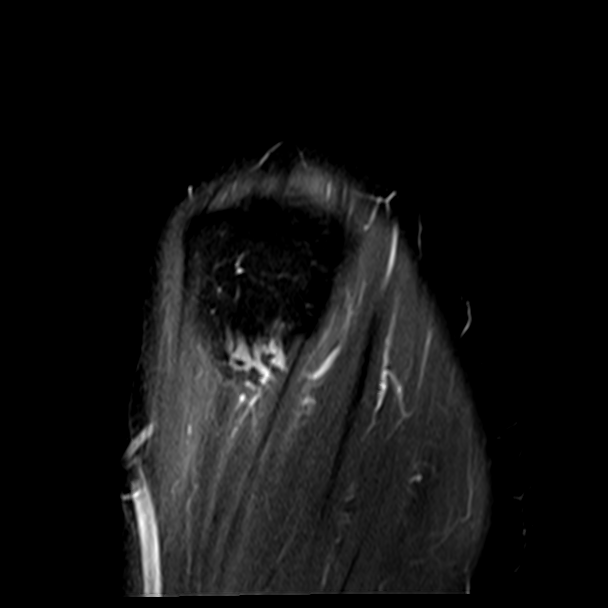
[im 22/22]
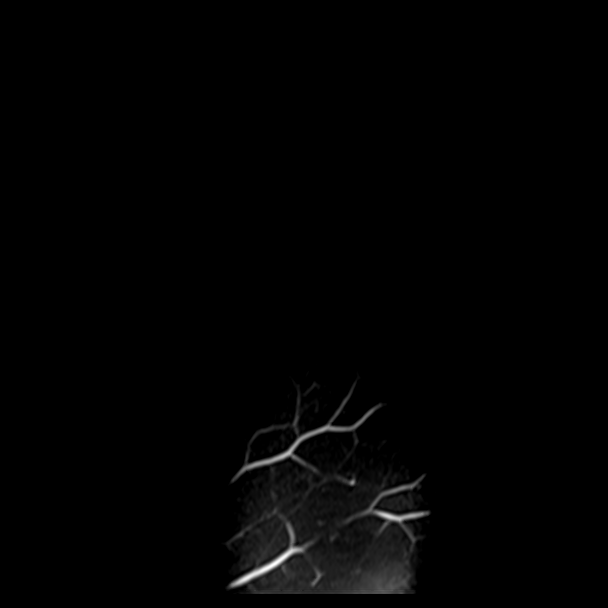

[Series 9: T1 · oblique · left · 4.0mm · 0.36mm/px · 1 of 22 slices shown]
[im 1/22]
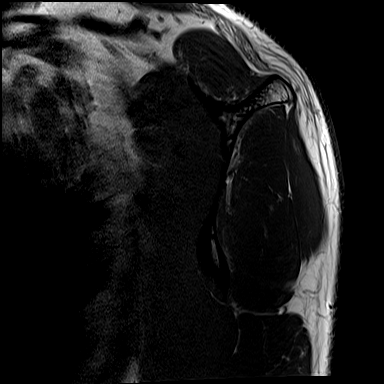

[32 of 40 positions shown; findings below may reference images not displayed]

FINDINGS: Rotator cuff: Low-grade interstitial tear involving the distal
subscapularis tendon without a full-thickness or retracted
component. The supraspinatus, infraspinatus, and teres minor tendons
are intact.

Muscles: Intramuscular edema within the supraspinatus and
infraspinatus muscle bellies without atrophy or fatty infiltration.
There is also mild intramuscular edema within the visualized
portions of the anterior deltoid and pectoralis major muscles. No
intramuscular fluid collections.

Biceps long head:  Intact.

Acromioclavicular Joint: Normal acromioclavicular joint. No
subacromial/subdeltoid bursal fluid.

Glenohumeral Joint: No joint effusion. No chondral defect.

Labrum: Grossly intact, but evaluation is slightly limited by lack
of intraarticular fluid.

Bones:  No marrow abnormality, fracture or dislocation.

Other: No mass lesion identified within the suprascapular or
spinoglenoid notch. No abnormality of the quadrilateral space.
IMPRESSION: 1. Low-grade interstitial tear of the distal subscapularis tendon
without full-thickness or retracted component.
2. Otherwise, no evidence of internal derangement of the left
shoulder.
3. Mild intramuscular edema within the supraspinatus, infraspinatus,
anterior deltoid, and pectoralis major muscles. Findings are
nonspecific and can be seen in the setting of muscular strain. This
could also be seen in the setting of a neuritis such as
Zuta syndrome or other neuropathy. No evidence of an
impinging neural lesion on MRI.

## 2022-02-04 DIAGNOSIS — M25511 Pain in right shoulder: Secondary | ICD-10-CM | POA: Diagnosis not present

## 2022-02-04 DIAGNOSIS — M7582 Other shoulder lesions, left shoulder: Secondary | ICD-10-CM | POA: Diagnosis not present

## 2022-02-04 DIAGNOSIS — M25512 Pain in left shoulder: Secondary | ICD-10-CM | POA: Diagnosis not present

## 2022-02-28 ENCOUNTER — Telehealth: Payer: Self-pay

## 2022-02-28 NOTE — Telephone Encounter (Signed)
Prior authorization was initiated via CoverMyMeds for prescription of Omeprazole 20 MG capsules.   KEY: [id number]

## 2022-03-03 NOTE — Telephone Encounter (Signed)
Noted  

## 2022-03-03 NOTE — Telephone Encounter (Signed)
Prior authorization was initiated via CoverMyMeds for prescription of Omeprazole. Outcome message stated "Clinical Override not needed." Spoke with [facility] pharmacy to check status and was informed patient picked up a 90-day supply on 01/30/22. Pharmacy says the insurance should cover it when patient is due for refill.

## 2022-03-29 ENCOUNTER — Other Ambulatory Visit: Payer: Self-pay

## 2022-03-29 ENCOUNTER — Emergency Department (HOSPITAL_COMMUNITY): Payer: BC Managed Care – PPO

## 2022-03-29 ENCOUNTER — Emergency Department (HOSPITAL_COMMUNITY)
Admission: EM | Admit: 2022-03-29 | Discharge: 2022-03-29 | Disposition: A | Discharge status: Home / Self Care | Payer: BC Managed Care – PPO | Attending: Emergency Medicine | Admitting: Emergency Medicine

## 2022-03-29 ENCOUNTER — Encounter: Payer: Self-pay | Admitting: Nurse Practitioner

## 2022-03-29 ENCOUNTER — Encounter (HOSPITAL_COMMUNITY): Payer: Self-pay | Admitting: Emergency Medicine

## 2022-03-29 DIAGNOSIS — R110 Nausea: Secondary | ICD-10-CM

## 2022-03-29 DIAGNOSIS — S199XXA Unspecified injury of neck, initial encounter: Secondary | ICD-10-CM | POA: Diagnosis not present

## 2022-03-29 DIAGNOSIS — R42 Dizziness and giddiness: Secondary | ICD-10-CM | POA: Diagnosis not present

## 2022-03-29 DIAGNOSIS — W182XXA Fall in (into) shower or empty bathtub, initial encounter: Secondary | ICD-10-CM | POA: Diagnosis not present

## 2022-03-29 DIAGNOSIS — S0101XA Laceration without foreign body of scalp, initial encounter: Secondary | ICD-10-CM | POA: Insufficient documentation

## 2022-03-29 DIAGNOSIS — W19XXXA Unspecified fall, initial encounter: Secondary | ICD-10-CM

## 2022-03-29 DIAGNOSIS — Y92002 Bathroom of unspecified non-institutional (private) residence single-family (private) house as the place of occurrence of the external cause: Secondary | ICD-10-CM | POA: Insufficient documentation

## 2022-03-29 DIAGNOSIS — Z043 Encounter for examination and observation following other accident: Secondary | ICD-10-CM | POA: Diagnosis not present

## 2022-03-29 DIAGNOSIS — S0990XA Unspecified injury of head, initial encounter: Secondary | ICD-10-CM | POA: Diagnosis not present

## 2022-03-29 LAB — CBC WITH DIFFERENTIAL/PLATELET
Abs Immature Granulocytes: 0.03 10*3/uL (ref 0.00–0.07)
Basophils Absolute: 0 10*3/uL (ref 0.0–0.1)
Basophils Relative: 1 %
Eosinophils Absolute: 0.1 10*3/uL (ref 0.0–0.5)
Eosinophils Relative: 1 %
HCT: 48.7 % (ref 39.0–52.0)
Hemoglobin: 16.8 g/dL (ref 13.0–17.0)
Immature Granulocytes: 0 %
Lymphocytes Relative: 23 %
Lymphs Abs: 2 10*3/uL (ref 0.7–4.0)
MCH: 31.6 pg (ref 26.0–34.0)
MCHC: 34.5 g/dL (ref 30.0–36.0)
MCV: 91.5 fL (ref 80.0–100.0)
Monocytes Absolute: 0.7 10*3/uL (ref 0.1–1.0)
Monocytes Relative: 8 %
Neutro Abs: 5.9 10*3/uL (ref 1.7–7.7)
Neutrophils Relative %: 67 %
Platelets: 161 10*3/uL (ref 150–400)
RBC: 5.32 MIL/uL (ref 4.22–5.81)
RDW: 12.2 % (ref 11.5–15.5)
WBC: 8.8 10*3/uL (ref 4.0–10.5)
nRBC: 0 % (ref 0.0–0.2)

## 2022-03-29 LAB — TROPONIN I (HIGH SENSITIVITY): Troponin I (High Sensitivity): 9 ng/L (ref <18)

## 2022-03-29 MED ORDER — ONDANSETRON HCL 4 MG/2ML IJ SOLN
4.0000 mg | Freq: Once | INTRAMUSCULAR | Status: AC
  Administered 2022-03-29: 4 mg via INTRAVENOUS
  Filled 2022-03-29: qty 2

## 2022-03-29 MED ORDER — LACTATED RINGERS IV BOLUS
2000.0000 mL | Freq: Once | INTRAVENOUS | Status: AC
  Administered 2022-03-29: 2000 mL via INTRAVENOUS

## 2022-03-29 MED ORDER — GLUCAGON HCL RDNA (DIAGNOSTIC) 1 MG IJ SOLR
1.0000 mg | Freq: Once | INTRAMUSCULAR | Status: AC
  Administered 2022-03-29: 1 mg via INTRAVENOUS
  Filled 2022-03-29: qty 1

## 2022-03-29 MED ORDER — TETANUS-DIPHTH-ACELL PERTUSSIS 5-2.5-18.5 LF-MCG/0.5 IM SUSY: 0.5000 mL | PREFILLED_SYRINGE | Freq: Once | INTRAMUSCULAR | Status: DC

## 2022-03-29 MED ORDER — LACTATED RINGERS IV BOLUS: 1000.0000 mL | Freq: Once | INTRAVENOUS | Status: DC

## 2022-03-29 NOTE — ED Provider Triage Note (Signed)
Emergency Medicine Provider Triage Evaluation Note  Carl Copeland , a 36 y.o. male  was evaluated in triage.  Pt complains of head injury. Slip and fall while in shower. Hit top of head with overlying laceration. Unknown last tetanus. Dizziness and nausea while on way to hospital. Ambulatory PTA. Pain goes into posterior occiput to head. No pain to back, Chest, abd  Review of Systems  Positive: Head injury Negative:   Physical Exam  There were no vitals taken for this visit. Gen:   Awake, no distress   Head:  Laceration to head Neck:  No midline tenderness Resp:  Normal effort  MSK:   Moves extremities without difficulty  Other:    Medical Decision Making  Medically screening exam initiated at 2:36 AM.  Appropriate orders placed.  Carl Copeland was informed that the remainder of the evaluation will be completed by another provider, this initial triage assessment does not replace that evaluation, and the importance of remaining in the ED until their evaluation is complete.  Fall, head injury   Wilkins Elpers A, PA-C 03/29/22 [id number]

## 2022-03-29 NOTE — ED Notes (Signed)
Pt refuses any further interventions and wishes to be discharged. I spoke with him in depth regarding this decision and the major risks he is taking by leaving AMA. I stated this could possibly lead up to permanent disability or death. Pt stated multiple times he understood everything and still wants to be discharged. I will speak with the doctor directly.

## 2022-03-29 NOTE — Discharge Instructions (Signed)
We can please return anytime you change your mind and would like to be evaluated.  Understand that you are leaving [place].  The doctor that saw you overnight was worried about the cause of your loss of consciousness and was worried that this might be something that could kill you or leave you permanently disabled if it was not further worked up.

## 2022-03-29 NOTE — ED Notes (Signed)
Pt nauseated just after the  glucagon was given  nausea immediately

## 2022-03-29 NOTE — ED Triage Notes (Addendum)
Patient fell out of shower and hit head on tile floor.  Patient denies any LOC.  States he remembers most of the incident.  Laceration to top of head.  No blood thinners.  States that he feels a little dizzy.

## 2022-03-29 NOTE — ED Provider Notes (Signed)
Received patient in turnover from Dr. Randal Buba.  Please see their note for further details of Hx, PE.  Briefly patient is a 36 y.o. male with a Fall .  Unknown exactly why the patient fell, thought that he syncopized.  Was complaining of some chest discomfort in the waiting room.  Found to be hypotensive.  Was given a large amount of metoprolol by his wife in the waiting room.  Brought rapidly back to a room with his hypotension.  Given glucagon and fluids with some improvement.  Current plan is to obtain a CT angiogram of the chest abdomen pelvis.  Repeat bolus of IV fluids.  Reassess.  Was notified by nursing staff that the patient was asking if he could leave.  I discussed with him risks and benefits of further imaging and lab work and potentially observation in the ER versus admission to the hospital.  The patient told me that he understands the risk and that he thinks that he will be fine and would return should anything change.  I had the patient's sign out AGAINST MEDICAL ADVICE.  He understood that he could return anytime he changes his mind and we would reevaluate him.    Deno Etienne, DO 03/29/22 [phone]

## 2022-03-29 NOTE — ED Provider Notes (Signed)
[facility] EMERGENCY DEPARTMENT Provider Note   CSN: [id number] Arrival date & time: 03/29/22  0159     History  Chief Complaint  Patient presents with   Carl Copeland    Carl Copeland is a 36 y.o. male.  The history is provided by the patient and the spouse. The history is limited by the condition of the patient.  Fall This is a new problem. The current episode started less than 1 hour ago. The problem occurs constantly. The problem has not changed since onset.Pertinent negatives include no abdominal pain, no headaches and no shortness of breath. Associated symptoms comments: Heart hurt post fall so wife gave patient metoprolol while he was waiting and patient is now hypotensive.  . Nothing aggravates the symptoms. Nothing relieves the symptoms. He has tried nothing for the symptoms. The treatment provided no relief.  Patient with muscular dystrophy and arrhythmia presents with fall in shower at hotel.  Stated to his wife his heart hurt and she gave him metoprolol while waiting.  BP 71/48 in waiting room.       Home Medications Prior to Admission medications   Medication Sig Start Date End Date Taking? Authorizing Provider  busPIRone (BUSPAR) 5 MG tablet TAKE 1 TABLET (5 MG TOTAL) BY MOUTH 2 (TWO) TIMES DAILY AS NEEDED (FOR ANXIETY OR HYPERVENTILATION). 10/08/20   Aura Dials T, NP  doxycycline (VIBRAMYCIN) 100 MG capsule Take by mouth. 04/18/21   [provider]  famotidine (PEPCID) 40 MG tablet Take 1 tablet (40 mg total) by mouth at bedtime. 05/22/21   Cannady, Corrie Dandy T, NP  finasteride (PROSCAR) 5 MG tablet Take 1 tablet (5 mg total) by mouth daily. 05/22/21   Cannady, Corrie Dandy T, NP  metoprolol succinate (TOPROL-XL) 25 MG 24 hr tablet Take 1 tablet (25 mg total) by mouth daily. 05/22/21   Aura Dials T, NP  Multiple Vitamin (MULTI-VITAMIN) tablet Take 1 tablet by mouth daily.    [provider]  Omega-3 Fatty Acids (RA FISH OIL) 1000 MG CAPS Take  by mouth.    [provider]  omeprazole (PRILOSEC) 20 MG capsule Take 1 capsule (20 mg total) by mouth 2 (two) times daily before a meal. 05/22/21   Cannady, Jolene T, NP  sildenafil (VIAGRA) 50 MG tablet Take 1 tablet (50 mg total) by mouth daily as needed for erectile dysfunction. 07/05/21   Marjie Skiff, NP      Allergies    Other and Penicillins    Review of Systems   Review of Systems  Constitutional:  Negative for fever.  HENT:  Negative for facial swelling.   Respiratory:  Negative for shortness of breath.   Gastrointestinal:  Negative for abdominal pain.  Neurological:  Negative for headaches.    Physical Exam Updated Vital Signs BP (!) 71/48 (BP Location: Right Arm)   Pulse 85   Temp (!) 97.5 F (36.4 C)   Resp 20   SpO2 98%  Physical Exam Vitals and nursing note reviewed.  Constitutional:      General: He is not in acute distress.    Appearance: He is well-developed. He is not diaphoretic.  HENT:     Head: Normocephalic.     Nose: Nose normal.  Eyes:     Conjunctiva/sclera: Conjunctivae normal.     Pupils: Pupils are equal, round, and reactive to light.  Cardiovascular:     Rate and Rhythm: Normal rate and regular rhythm.     Pulses: Normal pulses.  Heart sounds: Normal heart sounds.  Pulmonary:     Effort: Pulmonary effort is normal.     Breath sounds: Normal breath sounds. No wheezing or rales.  Abdominal:     General: Bowel sounds are normal.     Palpations: Abdomen is soft.     Tenderness: There is no abdominal tenderness. There is no guarding or rebound.  Musculoskeletal:        General: Normal range of motion.     Cervical back: Normal range of motion and neck supple.  Skin:    General: Skin is warm and dry.     Capillary Refill: Capillary refill takes less than 2 seconds.  Neurological:     General: No focal deficit present.     Mental Status: He is alert and oriented to person, place, and time.     Deep Tendon Reflexes:  Reflexes normal.  Psychiatric:        Thought Content: Thought content normal.     ED Results / Procedures / Treatments   Labs (all labs ordered are listed, but only abnormal results are displayed) Labs Reviewed  CBC WITH DIFFERENTIAL/PLATELET  I-STAT CHEM 8, ED  TROPONIN I (HIGH SENSITIVITY)    EKG None  Radiology CT Cervical Spine Wo Contrast  Result Date: 03/29/2022 CLINICAL DATA:  36 year old male status post fall from shower striking head on floor. Scalp laceration. Dizziness and nausea now. EXAM: CT CERVICAL SPINE WITHOUT CONTRAST TECHNIQUE: Multidetector CT imaging of the cervical spine was performed without intravenous contrast. Multiplanar CT image reconstructions were also generated. RADIATION DOSE REDUCTION: This exam was performed according to the departmental dose-optimization program which includes automated exposure control, adjustment of the mA and/or kV according to patient size and/or use of iterative reconstruction technique. COMPARISON:  Head CT today.  Cervical spine CT 01/08/2020. FINDINGS: Alignment: Chronic straightening of cervical lordosis. Cervicothoracic junction alignment is within normal limits. Bilateral posterior element alignment is within normal limits. Skull base and vertebrae: Visualized skull base is intact. No atlanto-occipital dissociation. C1 and C2 appear intact and aligned. Stable visualized osseous structures. Soft tissues and spinal canal: No prevertebral fluid or swelling. No visible canal hematoma. Small volume retained secretions in the pharynx. Stable visible noncontrast neck soft tissues, with conspicuous chronic bilateral deep cervical paraspinal muscle atrophy. Disc levels:  No significant cervical spine degeneration. Upper chest: Negative aside from a degree of upper thoracic spine deep paraspinal muscle atrophy. Other: Possible chronic bilateral mandible deformities partially visible, stable. IMPRESSION: 1. No acute traumatic injury  identified in the cervical spine. 2. Chronic deep paraspinal muscle atrophy, query underlying muscular dystrophy or similar process. Electronically Signed   By: Odessa Fleming M.D.   On: 03/29/2022 04:28   CT HEAD WO CONTRAST ( )  Result Date: 03/29/2022 CLINICAL DATA:  36 year old male status post fall from shower striking head on floor. Scalp laceration. Dizziness and nausea now. EXAM: CT HEAD WITHOUT CONTRAST TECHNIQUE: Contiguous axial images were obtained from the base of the skull through the vertex without intravenous contrast. RADIATION DOSE REDUCTION: This exam was performed according to the departmental dose-optimization program which includes automated exposure control, adjustment of the mA and/or kV according to patient size and/or use of iterative reconstruction technique. COMPARISON:  Cervical spine CT today reported separately. Head CT 01/08/2020 and brain MRI 08/02/2019. FINDINGS: Brain: Cerebral volume remains normal. No midline shift, ventriculomegaly, mass effect, evidence of mass lesion, intracranial hemorrhage or evidence of cortically based acute infarction. Gray-white matter differentiation is within normal limits throughout  the brain. Vascular: No suspicious intracranial vascular hyperdensity. Skull: Chronic bilateral maxillary deformity and ORIF. Stable visualized osseous structures. Sinuses/Orbits: Chronic bilateral maxilla deformity, previous ORIF. Hypoplastic residual maxillary sinuses, especially on the left with chronic periosteal thickening. But Visualized paranasal sinuses and mastoids are stable and well aerated. Tympanic cavities are clear. Other: Left posterior vertex scalp laceration visible on series 4, image 87. Underlying calvarium appears intact. No measurable scalp hematoma. Visualized orbit soft tissues are within normal limits. Some chronic atrophy of the bilateral muscles of mastication. IMPRESSION: 1. Left posterior vertex scalp laceration without underlying skull  fracture. 2. Stable and normal noncontrast CT appearance of the brain. 3. Chronic bilateral maxilla deformity, ORIF, and masticator muscle atrophy. Electronically Signed   By: Genevie Ann M.D.   On: 03/29/2022 04:24    Procedures .Marland KitchenLaceration Repair  Date/Time: 03/29/2022 7:23 AM  Performed by: Veatrice Kells, MD Authorized by: Veatrice Kells, MD   Consent:    Consent obtained:  Verbal   Consent given by:  Patient   Risks discussed:  Infection, need for additional repair and nerve damage   Alternatives discussed:  Delayed treatment Universal protocol:    Patient identity confirmed:  Arm band Laceration details:    Length (cm):  1   Depth (mm):  0.5 Pre-procedure details:    Preparation:  Patient was prepped and draped in usual sterile fashion Exploration:    Hemostasis achieved with:  Direct pressure   Imaging outcome: foreign body not noted     Wound extent: fascia not violated   Treatment:    Area cleansed with:  Povidone-iodine and chlorhexidine   Amount of cleaning:  Extensive   Debridement:  None   Undermining:  None Skin repair:    Repair method:  Staples   Number of staples:  2 Approximation:    Approximation:  Close Repair type:    Repair type:  Simple Post-procedure details:    Dressing:  Sterile dressing   Procedure completion:  Tolerated well, no immediate complications     Medications Ordered in ED Medications  lactated ringers bolus 2,000 mL (2,000 mLs Intravenous New Bag/Given 03/29/22 0636)  glucagon (human recombinant) (GLUCAGEN) injection 1 mg (1 mg Intravenous Given 03/29/22 0640)  ondansetron (ZOFRAN) injection 4 mg (4 mg Intravenous Given 03/29/22 6387)    ED Course/ Medical Decision Making/ A&P                           Medical Decision Making Fall in the shower at hotel and then "heat hurt" metoprolol given by patient's wife   Amount and/or Complexity of Data Reviewed Independent Historian: spouse    Details: See above  External Data  Reviewed: notes.    Details: Previous outside notes reviewed  Labs: ordered. Radiology: ordered.  Risk Prescription drug management. Risk Details: Cardiac enzymes and dissection CTA ordered by me as patient had chest pain and was hypotensive.  The troponins and CTA and further labs and imaging were signed out to Dr. Tyrone Nine at 7 am.  Disposition based on labs imaging and response to treatment     Final Clinical Impression(s) / ED Diagnoses Final diagnoses:  None   Signed out to Dr. Tyrone Nine pending labs imaging and reassement Rx / DC Orders     Suraiya Dickerson, MD 03/30/22 0045

## 2022-04-04 ENCOUNTER — Encounter: Payer: Self-pay | Admitting: Physician Assistant

## 2022-04-04 ENCOUNTER — Ambulatory Visit: Payer: BC Managed Care – PPO | Admitting: Physician Assistant

## 2022-04-04 VITALS — BP 91/62 | HR 69 | Temp 97.7°F | Wt 150.6 lb

## 2022-04-04 DIAGNOSIS — Z4802 Encounter for removal of sutures: Secondary | ICD-10-CM | POA: Diagnosis not present

## 2022-04-04 DIAGNOSIS — S0101XD Laceration without foreign body of scalp, subsequent encounter: Secondary | ICD-10-CM

## 2022-04-04 NOTE — Progress Notes (Signed)
Acute Office Visit   Patient: Carl Copeland   DOB: 1985/12/15   36 y.o. Male  MRN: [id number] Visit Date: 04/04/2022  Today's healthcare provider: Dani Gobble Knolan Simien, PA-C  Introduced myself to the patient as a Journalist, newspaper and provided education on APPs in clinical practice.    Chief Complaint  Patient presents with   Suture / Staple Removal    Patient says he is here to have Staple Removed from Head. Patient says he was told they would need to be removed 5 days from last Saturday when he had them placed.    Subjective    HPI HPI     Suture / Staple Removal    Additional comments: Patient says he is here to have Staple Removed from Head. Patient says he was told they would need to be removed 5 days from last Saturday when he had them placed.       Last edited by Irena Reichmann, Corinne on 04/04/2022 11:27 AM.        [name] removal   Patient was seen in the ED on 03/29/22 for a fall  He was evaluated with CT head and cervical spine as well as CXR- all negative for underlying acute injury or fracture Was given 2 staples for 1 cm long head laceration and told to follow up for removal in 5 days     Medications: Outpatient Medications Prior to Visit  Medication Sig   busPIRone (BUSPAR) 5 MG tablet TAKE 1 TABLET (5 MG TOTAL) BY MOUTH 2 (TWO) TIMES DAILY AS NEEDED (FOR ANXIETY OR HYPERVENTILATION).   doxycycline (VIBRAMYCIN) 100 MG capsule Take by mouth.   famotidine (PEPCID) 40 MG tablet Take 1 tablet (40 mg total) by mouth at bedtime.   finasteride (PROSCAR) 5 MG tablet Take 1 tablet (5 mg total) by mouth daily.   metoprolol succinate (TOPROL-XL) 25 MG 24 hr tablet Take 1 tablet (25 mg total) by mouth daily.   Multiple Vitamin (MULTI-VITAMIN) tablet Take 1 tablet by mouth daily.   Omega-3 Fatty Acids (RA FISH OIL) 1000 MG CAPS Take by mouth.   omeprazole (PRILOSEC) 20 MG capsule Take 1 capsule (20 mg total) by mouth 2 (two) times daily before a meal.   sildenafil (VIAGRA) 50 MG  tablet Take 1 tablet (50 mg total) by mouth daily as needed for erectile dysfunction.   No facility-administered medications prior to visit.    Review of Systems  Skin:  Positive for wound.       Laceration of head from fall        Objective    BP 91/62   Pulse 69   Temp 97.7 F (36.5 C) (Oral)   Wt 150 lb 9.6 oz (68.3 kg)   SpO2 99%   BMI 21.00 kg/m    Physical Exam Vitals reviewed.  Constitutional:      General: He is awake.     Appearance: Normal appearance. He is well-developed, well-groomed and normal weight.  HENT:     Head: Normocephalic. Laceration present.   Pulmonary:     Effort: Pulmonary effort is normal.  Neurological:     General: No focal deficit present.     Mental Status: He is alert and oriented to person, place, and time.     Cranial Nerves: No dysarthria or facial asymmetry.     Motor: No weakness.     Gait: Gait is intact.  Psychiatric:  Attention and Perception: Attention and perception normal.        Mood and Affect: Mood and affect normal.        Speech: Speech normal.        Behavior: Behavior normal. Behavior is cooperative.       No results found for any visits on 04/04/22.  Assessment & Plan      No follow-ups on file.       Problem List Items Addressed This Visit   None Visit Diagnoses     Scalp laceration, subsequent encounter    -  Primary Acute, ongoing concern Patient fell and was seen in the ED on 03/29/22  Reviewed ED notes, CT head and neck results as well as CXR and labs  Patient is here today for removal of the 2 staples that were placed for 1 cm scalp laceration  Staples were removed without issue or need for local anesthesia - patient tolerated removal well  Reviewed care measures- recommend gentle washing with warm water and mild soap for the next 10 days- no scrubbing or harsh irritants to prevent re-opening of wound Patient verbalized understanding of instructions and return precautions Follow up as  needed for concerns    Encounter for removal of staples            No follow-ups on file.   I, Justus Droke E Gavriella Hearst, PA-C, have reviewed all documentation for this visit. The documentation on 04/04/22 for the exam, diagnosis, procedures, and orders are all accurate and complete.   Jacquelin Hawking, MHS, PA-C [facility]

## 2022-04-13 NOTE — Patient Instructions (Signed)

## 2022-04-16 ENCOUNTER — Encounter: Payer: Self-pay | Admitting: Nurse Practitioner

## 2022-04-16 ENCOUNTER — Ambulatory Visit: Payer: BC Managed Care – PPO | Admitting: Nurse Practitioner

## 2022-04-16 VITALS — BP 104/64 | HR 58 | Temp 97.4°F | Resp 18 | Ht 72.2 in | Wt 150.0 lb

## 2022-04-16 DIAGNOSIS — Z0000 Encounter for general adult medical examination without abnormal findings: Secondary | ICD-10-CM | POA: Diagnosis not present

## 2022-04-16 DIAGNOSIS — Z13220 Encounter for screening for lipoid disorders: Secondary | ICD-10-CM | POA: Diagnosis not present

## 2022-04-16 DIAGNOSIS — K580 Irritable bowel syndrome with diarrhea: Secondary | ICD-10-CM

## 2022-04-16 DIAGNOSIS — Z113 Encounter for screening for infections with a predominantly sexual mode of transmission: Secondary | ICD-10-CM

## 2022-04-16 DIAGNOSIS — F5101 Primary insomnia: Secondary | ICD-10-CM

## 2022-04-16 DIAGNOSIS — F411 Generalized anxiety disorder: Secondary | ICD-10-CM

## 2022-04-16 DIAGNOSIS — G7111 Myotonic muscular dystrophy: Secondary | ICD-10-CM

## 2022-04-16 DIAGNOSIS — Z23 Encounter for immunization: Secondary | ICD-10-CM | POA: Diagnosis not present

## 2022-04-16 DIAGNOSIS — D696 Thrombocytopenia, unspecified: Secondary | ICD-10-CM

## 2022-04-16 DIAGNOSIS — Z136 Encounter for screening for cardiovascular disorders: Secondary | ICD-10-CM

## 2022-04-16 DIAGNOSIS — K219 Gastro-esophageal reflux disease without esophagitis: Secondary | ICD-10-CM | POA: Diagnosis not present

## 2022-04-16 MED ORDER — OMEPRAZOLE 20 MG PO CPDR: 20.0000 mg | DELAYED_RELEASE_CAPSULE | Freq: Two times a day (BID) | ORAL | 4 refills | Status: DC

## 2022-04-16 MED ORDER — FAMOTIDINE 40 MG PO TABS: 40.0000 mg | ORAL_TABLET | Freq: Every day | ORAL | 4 refills | Status: AC

## 2022-04-16 MED ORDER — METOPROLOL SUCCINATE ER 25 MG PO TB24: 25.0000 mg | ORAL_TABLET | Freq: Every day | ORAL | 4 refills | Status: AC

## 2022-04-16 MED ORDER — DOXEPIN HCL 6 MG PO TABS: 1.0000 | ORAL_TABLET | Freq: Every evening | ORAL | 4 refills | Status: AC

## 2022-04-16 MED ORDER — BUSPIRONE HCL 5 MG PO TABS: 5.0000 mg | ORAL_TABLET | Freq: Two times a day (BID) | ORAL | 4 refills | Status: AC | PRN

## 2022-04-16 NOTE — Assessment & Plan Note (Signed)
Chronic, stable.  Minimal Buspar use, will continue this as needed.  Consider SSRI, such as Zoloft, if increased symptoms.  Denies SI/HI -- reports occasional fleeting thoughts, but none recent -- notices more if lack of sleep and has been out of Doxepin.  Has safety plan in place with his father if thoughts turn more into plans.  Refills sent.  TSH and CBC today.

## 2022-04-16 NOTE — Assessment & Plan Note (Signed)
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  Continue current regimen and adjust as needed.  Mag level today.  Refills sent. Risks of PPI use were discussed with patient including bone loss, C. Diff diarrhea, pneumonia, infections, CKD, electrolyte abnormalities.  Verbalizes understanding and chooses to continue the medication.

## 2022-04-16 NOTE — Progress Notes (Signed)
BP 104/64 (BP Location: Left Arm, Patient Position: Sitting, Cuff Size: Normal)   Pulse (!) 58   Temp (!) 97.4 F (36.3 C) (Oral)   Resp 18   Ht 6' 0.2" (1.834 m)   Wt 150 lb (68 kg)   SpO2 96%   BMI 20.23 kg/m    Subjective:    Patient ID: Carl Copeland, male    DOB: December 19, 1985, 36 y.o.   MRN: [id number]  HPI: Carl Copeland is a 36 y.o. male presenting on 04/16/2022 for comprehensive medical examination. Current medical complaints include:none  He currently lives with: room mate Interim Problems from his last visit: no  GERD Continues on Pepcid + Omeprazole, which helps with saliva and swallowing.     GERD control status: stable Satisfied with current treatment? yes Heartburn frequency: occasional Medication side effects: no  Medication compliance: stable Dysphagia: no Odynophagia:  no Hematemesis: no Blood in stool: no EGD: yes    ANXIETY/STRESS Currently has Buspar but rarely using.  Reports he has been without Doxepin for sleep and this has offset his mood.  Denies SI, reports only very rare fleeting thoughts but no plans or recent thoughts.   Followed by Kateri Mc, originally [facility], for myotonic dystrophy which is a stressor for him.  Saw [facility] neurology last 01/18/21, plus seeing ophthalmology, last 07/04/21.  Follows with cardiology as needed.  Currently reports he has transferred to neurology at [facility] due to possible trials -- last saw 03/13/22. Taking Metoprolol per cardiology to assist with HR regulation. Duration:stable Anxious mood: no  Excessive worrying: no Irritability: no  Sweating: no Nausea: no Palpitations:no Hyperventilation: no Panic attacks: no Agoraphobia: no  Obscessions/compulsions: no Depressed mood: no    04/16/2022    9:22 AM 05/22/2021    4:14 PM 10/24/2020    5:41 PM 10/24/2020    4:19 PM 02/23/2019    8:21 AM  Depression screen PHQ 2/9  Decreased Interest 3 2 0 0 1  Down, Depressed, Hopeless 3 2 0 0 1  PHQ - 2 Score 6 4 0 0 2  Altered sleeping  3 3 0  3  Tired, decreased energy 3 3 0  3  Change in appetite 3 3 0  1  Feeling bad or failure about yourself  2 1 0  2  Trouble concentrating 3 3 0  1  Moving slowly or fidgety/restless 2 3 0  3  Suicidal thoughts 1 2 0  0  PHQ-9 Score 23 22 0  15  Difficult doing work/chores Very difficult  Not difficult at all  Not difficult at all       04/16/2022    9:22 AM 05/22/2021    4:14 PM 10/24/2020    5:42 PM 02/23/2019    8:20 AM  GAD 7 : Generalized Anxiety Score  Nervous, Anxious, on Edge 2 3 0 1  Control/stop worrying 2 3 0 1  Worry too much - different things 2 3 0 1  Trouble relaxing 2 3 0 1  Restless 2 3 0 0  Easily annoyed or irritable 2 3 0 1  Afraid - awful might happen 2 3 0 1  Total GAD 7 Score 14 21 0 6  Anxiety Difficulty Very difficult Very difficult Not difficult at all Not difficult at all   Functional Status Survey: Does the patient have difficulty seeing, even when wearing glasses/contacts?: No Does the patient have difficulty concentrating, remembering, or making decisions?: No Does the patient have difficulty walking or climbing  stairs?: No Does the patient have difficulty dressing or bathing?: No Does the patient have difficulty doing errands alone such as visiting a doctor's office or shopping?: No  FALL RISK:    04/16/2022    9:38 AM  Fall Risk   Falls in the past year? 1  Number falls in past yr: 0  Injury with Fall? 0  Risk for fall due to : History of fall(s)  Follow up Falls prevention discussed   Advanced Directives <no information>  Past Medical History:  Past Medical History:  Diagnosis Date   Allergy    Anxiety    Cardiac dysrhythmia    GERD (gastroesophageal reflux disease)     Surgical History:  Past Surgical History:  Procedure Laterality Date   APPENDECTOMY     EYE SURGERY     MANDIBLE FRACTURE SURGERY      Medications:  Current Outpatient Medications on File Prior to Visit  Medication Sig   Multiple Vitamin  (MULTI-VITAMIN) tablet Take 1 tablet by mouth daily.   Omega-3 Fatty Acids (RA FISH OIL) 1000 MG CAPS Take by mouth.   No current facility-administered medications on file prior to visit.    Allergies:  Allergies  Allergen Reactions   Other     Cherries, pollen, peanuts     Penicillins Rash    diarrhea    Social History:  Social History   Socioeconomic History   Marital status: Single    Spouse name: Not on file   Number of children: Not on file   Years of education: Not on file   Highest education level: Not on file  Occupational History   Not on file  Tobacco Use   Smoking status: Former   Smokeless tobacco: Never  Vaping Use   Vaping Use: Never used  Substance and Sexual Activity   Alcohol use: Yes    Comment: socially   Drug use: Never   Sexual activity: Not Currently  Other Topics Concern   Not on file  Social History Narrative   Not on file   Social Determinants of Health   Financial Resource Strain: Low Risk  (12/24/2018)   Overall Financial Resource Strain (CARDIA)    Difficulty of Paying Living Expenses: Not hard at all  Food Insecurity: No Food Insecurity (12/24/2018)   Hunger Vital Sign    Worried About Running Out of Food in the Last Year: Never true    Ran Out of Food in the Last Year: Never true  Transportation Needs: No Transportation Needs (12/24/2018)   PRAPARE - Administrator, Civil ServiceTransportation    Lack of Transportation (Medical): No    Lack of Transportation (Non-Medical): No  Physical Activity: Insufficiently Active (12/24/2018)   Exercise Vital Sign    Days of Exercise per Week: 2 days    Minutes of Exercise per Session: 30 min  Stress: No Stress Concern Present (12/24/2018)   [employer] - Occupational Stress Questionnaire    Feeling of Stress : Only a little  Social Connections: Unknown (12/24/2018)   Social Connection and Isolation Panel [NHANES]    Frequency of Communication with Friends and Family: Twice a week     Frequency of Social Gatherings with Friends and Family: Twice a week    Attends Religious Services: Never    Database administratorActive Member of Clubs or Organizations: No    Attends BankerClub or Organization Meetings: Never    Marital Status: Not on file  Intimate Partner Violence: Not At Risk (12/24/2018)   Humiliation,  Afraid, Rape, and Kick questionnaire    Fear of Current or Ex-Partner: No    Emotionally Abused: No    Physically Abused: No    Sexually Abused: No   Social History   Tobacco Use  Smoking Status Former  Smokeless Tobacco Never   Social History   Substance and Sexual Activity  Alcohol Use Yes   Comment: socially    Family History:  Family History  Problem Relation Age of Onset   Muscular dystrophy Father    Heart disease Paternal Grandmother     Past medical history, surgical history, medications, allergies, family history and social history reviewed with patient today and changes made to appropriate areas of the chart.   ROS All other ROS negative except what is listed above and in the HPI.      Objective:    BP 104/64 (BP Location: Left Arm, Patient Position: Sitting, Cuff Size: Normal)   Pulse (!) 58   Temp (!) 97.4 F (36.3 C) (Oral)   Resp 18   Ht 6' 0.2" (1.834 m)   Wt 150 lb (68 kg)   SpO2 96%   BMI 20.23 kg/m   Wt Readings from Last 3 Encounters:  04/16/22 150 lb (68 kg)  04/04/22 150 lb 9.6 oz (68.3 kg)  05/22/21 142 lb (64.4 kg)    Physical Exam Vitals and nursing note reviewed.  Constitutional:      General: He is awake. He is not in acute distress.    Appearance: He is well-developed and well-groomed. He is not ill-appearing or toxic-appearing.  HENT:     Head: Normocephalic and atraumatic.     Right Ear: Hearing, tympanic membrane, ear canal and external ear normal. No drainage.     Left Ear: Hearing, tympanic membrane, ear canal and external ear normal. No drainage.     Nose: Nose normal.     Mouth/Throat:     Pharynx: Uvula midline.  Eyes:      General: Lids are normal.        Right eye: No discharge.        Left eye: No discharge.     Extraocular Movements: Extraocular movements intact.     Conjunctiva/sclera: Conjunctivae normal.     Pupils: Pupils are equal, round, and reactive to light.     Visual Fields: Right eye visual fields normal and left eye visual fields normal.  Neck:     Thyroid: No thyromegaly.     Vascular: No carotid bruit or JVD.     Trachea: Trachea normal.  Cardiovascular:     Rate and Rhythm: Normal rate and regular rhythm.     Heart sounds: Normal heart sounds, S1 normal and S2 normal. No murmur heard.    No gallop.  Pulmonary:     Effort: Pulmonary effort is normal. No accessory muscle usage or respiratory distress.     Breath sounds: Normal breath sounds.  Abdominal:     General: Bowel sounds are normal.     Palpations: Abdomen is soft. There is no hepatomegaly or splenomegaly.     Tenderness: There is no abdominal tenderness.  Musculoskeletal:        General: Normal range of motion.     Cervical back: Normal range of motion and neck supple.     Right lower leg: No edema.     Left lower leg: No edema.     Comments: Reduced muscular strength all extremities -- 3-4/5.  Lymphadenopathy:     Head:  Right side of head: No submental, submandibular, tonsillar, preauricular or posterior auricular adenopathy.     Left side of head: No submental, submandibular, tonsillar, preauricular or posterior auricular adenopathy.     Cervical: No cervical adenopathy.  Skin:    General: Skin is warm and dry.     Capillary Refill: Capillary refill takes less than 2 seconds.     Findings: No rash.  Neurological:     Mental Status: He is alert and oriented to person, place, and time.     Gait: Gait is intact.     Deep Tendon Reflexes: Reflexes are normal and symmetric.     Reflex Scores:      Brachioradialis reflexes are 2+ on the right side and 2+ on the left side.      Patellar reflexes are 2+ on the right  side and 2+ on the left side. Psychiatric:        Attention and Perception: Attention normal.        Mood and Affect: Mood normal.        Speech: Speech normal.        Behavior: Behavior normal. Behavior is cooperative.        Thought Content: Thought content normal.        Cognition and Memory: Cognition normal.     Results for orders placed or performed during the hospital encounter of 03/29/22  CBC with Differential  Result Value Ref Range   WBC 8.8 4.0 - 10.5 K/uL   RBC 5.32 4.22 - 5.81 MIL/uL   Hemoglobin 16.8 13.0 - 17.0 g/dL   HCT 58.0 99.8 - 33.8 %   MCV 91.5 80.0 - 100.0 fL   MCH 31.6 26.0 - 34.0 pg   MCHC 34.5 30.0 - 36.0 g/dL   RDW 25.0 53.9 - 76.7 %   Platelets 161 150 - 400 K/uL   nRBC 0.0 0.0 - 0.2 %   Neutrophils Relative % 67 %   Neutro Abs 5.9 1.7 - 7.7 K/uL   Lymphocytes Relative 23 %   Lymphs Abs 2.0 0.7 - 4.0 K/uL   Monocytes Relative 8 %   Monocytes Absolute 0.7 0.1 - 1.0 K/uL   Eosinophils Relative 1 %   Eosinophils Absolute 0.1 0.0 - 0.5 K/uL   Basophils Relative 1 %   Basophils Absolute 0.0 0.0 - 0.1 K/uL   Immature Granulocytes 0 %   Abs Immature Granulocytes 0.03 0.00 - 0.07 K/uL  Troponin I (High Sensitivity)  Result Value Ref Range   Troponin I (High Sensitivity) 9 <18 ng/L      Assessment & Plan:   Problem List Items Addressed This Visit       Digestive   Acid reflux    Chronic, stable with current regimen with helps with GERD and his myotonic dystrophy.  Continue current regimen and adjust as needed.  Mag level today.  Refills sent. Risks of PPI use were discussed with patient including bone loss, C. Diff diarrhea, pneumonia, infections, CKD, electrolyte abnormalities.  Verbalizes understanding and chooses to continue the medication.       Relevant Medications   famotidine (PEPCID) 40 MG tablet   omeprazole (PRILOSEC) 20 MG capsule   Other Relevant Orders   Magnesium     Musculoskeletal and Integument   Myotonic dystrophy, type 1  (HCC) - Primary    Chronic, progressive.  At this time continue collaboration with [facility] neurology and orthopedics.  He is working in trial at [employer], no  notes available on chart for this.      Relevant Orders   Comprehensive metabolic panel   TSH     Hematopoietic and Hemostatic   Thrombocytopenia (HCC)    Noted on past labs, mild.  Recheck CBC and CMP today.      Relevant Orders   CBC with Differential/Platelet     Other   Generalized anxiety disorder    Chronic, stable.  Minimal Buspar use, will continue this as needed.  Consider SSRI, such as Zoloft, if increased symptoms.  Denies SI/HI -- reports occasional fleeting thoughts, but none recent -- notices more if lack of sleep and has been out of Doxepin.  Has safety plan in place with his father if thoughts turn more into plans.  Refills sent.  TSH and CBC today.      Relevant Medications   busPIRone (BUSPAR) 5 MG tablet   Insomnia    Chronic, currently treated with Doxepin. Will continue this treatment, at this time he only uses as needed.  Refill sent for 6 MG tablets, increasing dose for improved sleep pattern.      Other Visit Diagnoses     Screen for STD (sexually transmitted disease)       STD screening per request today.   Relevant Orders   GC/Chlamydia Probe Amp   HIV Antibody (routine testing w rflx)   HSV 1 and 2 Ab, IgG   RPR   Encounter for lipid screening for cardiovascular disease       Relevant Orders   Lipid Panel w/o Chol/HDL Ratio   Flu vaccine need       Flu vaccine in office today.   Encounter for annual physical exam       Annual physical today with labs and health maintenance reviewed, discussed with patient.        IMMUNIZATIONS:   - Tdap: Tetanus vaccination status reviewed: last tetanus booster within 10 years. - Influenza: Up to date - Pneumovax: Not applicable - Prevnar: Not applicable - Zostavax vaccine: Not applicable  SCREENING: - Colonoscopy: Not applicable  Discussed with  patient purpose of the colonoscopy is to detect colon cancer at curable precancerous or early stages   - AAA Screening: Not applicable  -Hearing Test: Not applicable  -Spirometry: Not applicable   PATIENT COUNSELING:    Sexuality: Discussed sexually transmitted diseases, partner selection, use of condoms, avoidance of unintended pregnancy  and contraceptive alternatives.   Advised to avoid cigarette smoking.  I discussed with the patient that most people either abstain from alcohol or drink within safe limits (<=14/week and <=4 drinks/occasion for males, <=7/weeks and <= 3 drinks/occasion for females) and that the risk for alcohol disorders and other health effects rises proportionally with the number of drinks per week and how often a drinker exceeds daily limits.  Discussed cessation/primary prevention of drug use and availability of treatment for abuse.   Diet: Encouraged to adjust caloric intake to maintain  or achieve ideal body weight, to reduce intake of dietary saturated fat and total fat, to limit sodium intake by avoiding high sodium foods and not adding table salt, and to maintain adequate dietary potassium and calcium preferably from fresh fruits, vegetables, and low-fat dairy products.    Stressed the importance of regular exercise  Injury prevention: Discussed safety belts, safety helmets, smoke detector, smoking near bedding or upholstery.   Dental health: Discussed importance of regular tooth brushing, flossing, and dental visits.   Follow up plan: NEXT PREVENTATIVE PHYSICAL DUE  IN 1 YEAR. No follow-ups on file.

## 2022-04-16 NOTE — Assessment & Plan Note (Signed)
Chronic, progressive.  At this time continue collaboration with [facility] neurology and orthopedics.  He is working in trial at [employer], no notes available on chart for this.

## 2022-04-16 NOTE — Assessment & Plan Note (Signed)
Chronic, currently treated with Doxepin. Will continue this treatment, at this time he only uses as needed.  Refill sent for 6 MG tablets, increasing dose for improved sleep pattern.

## 2022-04-16 NOTE — Assessment & Plan Note (Signed)
Noted on past labs, mild.  Recheck CBC and CMP today.

## 2022-04-17 ENCOUNTER — Telehealth: Payer: Self-pay

## 2022-04-17 LAB — LIPID PANEL W/O CHOL/HDL RATIO
Cholesterol, Total: 194 mg/dL (ref 100–199)
HDL: 49 mg/dL (ref >39)
LDL Chol Calc (NIH): 115 mg/dL — ABNORMAL HIGH (ref 0–99)
Triglycerides: 173 mg/dL — ABNORMAL HIGH (ref 0–149)
VLDL Cholesterol Cal: 30 mg/dL (ref 5–40)

## 2022-04-17 LAB — CBC WITH DIFFERENTIAL/PLATELET
Basophils Absolute: 0 10*3/uL (ref 0.0–0.2)
Basos: 1 %
EOS (ABSOLUTE): 0.2 10*3/uL (ref 0.0–0.4)
Eos: 3 %
Hematocrit: 47.5 % (ref 37.5–51.0)
Hemoglobin: 16.5 g/dL (ref 13.0–17.7)
Immature Grans (Abs): 0 10*3/uL (ref 0.0–0.1)
Immature Granulocytes: 0 %
Lymphocytes Absolute: 1.9 10*3/uL (ref 0.7–3.1)
Lymphs: 39 %
MCH: 31.4 pg (ref 26.6–33.0)
MCHC: 34.7 g/dL (ref 31.5–35.7)
MCV: 91 fL (ref 79–97)
Monocytes Absolute: 0.4 10*3/uL (ref 0.1–0.9)
Monocytes: 9 %
Neutrophils Absolute: 2.3 10*3/uL (ref 1.4–7.0)
Neutrophils: 48 %
Platelets: 174 10*3/uL (ref 150–450)
RBC: 5.25 x10E6/uL (ref 4.14–5.80)
RDW: 12.9 % (ref 11.6–15.4)
WBC: 4.8 10*3/uL (ref 3.4–10.8)

## 2022-04-17 LAB — HSV 1 AND 2 AB, IGG
HSV 1 Glycoprotein G Ab, IgG: <0.91 index (ref 0.00–0.90)
HSV 2 IgG, Type Spec: <0.91 index (ref 0.00–0.90)

## 2022-04-17 LAB — MAGNESIUM: Magnesium: 2.4 mg/dL — ABNORMAL HIGH (ref 1.6–2.3)

## 2022-04-17 LAB — COMPREHENSIVE METABOLIC PANEL
ALT: 56 IU/L — ABNORMAL HIGH (ref 0–44)
AST: 39 IU/L (ref 0–40)
Albumin/Globulin Ratio: 2 (ref 1.2–2.2)
Albumin: 4.6 g/dL (ref 4.1–5.1)
Alkaline Phosphatase: 67 IU/L (ref 44–121)
BUN/Creatinine Ratio: 12 (ref 9–20)
BUN: 10 mg/dL (ref 6–20)
Bilirubin Total: 0.7 mg/dL (ref 0.0–1.2)
CO2: 25 mmol/L (ref 20–29)
Calcium: 9.7 mg/dL (ref 8.7–10.2)
Chloride: 106 mmol/L (ref 96–106)
Creatinine, Ser: 0.82 mg/dL (ref 0.76–1.27)
Globulin, Total: 2.3 g/dL (ref 1.5–4.5)
Glucose: 85 mg/dL (ref 70–99)
Potassium: 4.8 mmol/L (ref 3.5–5.2)
Sodium: 145 mmol/L — ABNORMAL HIGH (ref 134–144)
Total Protein: 6.9 g/dL (ref 6.0–8.5)
eGFR: 117 mL/min/{1.73_m2} (ref >59)

## 2022-04-17 LAB — TSH: TSH: 1.59 u[IU]/mL (ref 0.450–4.500)

## 2022-04-17 LAB — HIV ANTIBODY (ROUTINE TESTING W REFLEX): HIV Screen 4th Generation wRfx: NONREACTIVE

## 2022-04-17 LAB — RPR: RPR Ser Ql: NONREACTIVE

## 2022-04-17 MED ORDER — OMEPRAZOLE 20 MG PO CPDR: 40.0000 mg | DELAYED_RELEASE_CAPSULE | Freq: Every day | ORAL | 4 refills | Status: AC

## 2022-04-17 NOTE — Telephone Encounter (Signed)
PA for Doxepin initiated and submitted via Cover My Meds. Key: [id number]

## 2022-04-17 NOTE — Telephone Encounter (Signed)
PA approved for Doxepin. Mychart message sent to the patient to notify him of approval.

## 2022-04-17 NOTE — Progress Notes (Signed)
Contacted via [place] [facility] [place], your labs have returned: - Kidney function, creatinine and eGFR, remains normal.  Sodium, salt, level a little elevated.  Recommend cut back on salt intake a little bit.  Liver function, AST and ALT, shows mild elevation in ALT -- cut back on any Tylenol use or alcohol use if present.  We will recheck next visit. - Magnesium level mildly high, if taking supplement start taking every other day versus daily. - CBC shows no anemia or infection and thyroid lab, TSH, is normal. - Current STD testing is negative, waiting on urine. - Your LDL is above normal. The LDL is the bad cholesterol. Over time and in combination with inflammation and other factors, this contributes to plaque which in turn may lead to stroke and/or heart attack down the road. Sometimes high LDL is primarily genetic, and people might be eating all the right foods but still have high numbers. Other times, there is room for improvement in one's diet and eating healthier can bring this number down and potentially reduce one's risk of heart attack and/or stroke.   To reduce your LDL, Remember - more fruits and vegetables, more fish, and limit red meat and dairy products. More soy, nuts, beans, barley, lentils, oats and plant sterol ester enriched margarine instead of butter. I also encourage eliminating sugar and processed food. Remember, shop on the outside of the grocery store and visit your [employer]. If you would like to talk with me about dietary changes for your cholesterol, please let me know. We should recheck your cholesterol in 12 months.  Any questions? Keep being amazing!!  Thank you for allowing me to participate in your care.  I appreciate you. Kindest regards, Chesni Vos

## 2022-04-19 LAB — GC/CHLAMYDIA PROBE AMP
Chlamydia trachomatis, NAA: NEGATIVE
Neisseria Gonorrhoeae by PCR: NEGATIVE

## 2022-04-19 NOTE — Progress Notes (Signed)
Contacted via MyChart   Good morning Carl Copeland, your urine STD screening is negative.  Great news!!

## 2022-05-05 DIAGNOSIS — H04123 Dry eye syndrome of bilateral lacrimal glands: Secondary | ICD-10-CM | POA: Diagnosis not present

## 2022-05-05 DIAGNOSIS — H0014 Chalazion left upper eyelid: Secondary | ICD-10-CM | POA: Diagnosis not present

## 2022-05-05 DIAGNOSIS — H0102A Squamous blepharitis right eye, upper and lower eyelids: Secondary | ICD-10-CM | POA: Diagnosis not present

## 2022-05-05 DIAGNOSIS — H0012 Chalazion right lower eyelid: Secondary | ICD-10-CM | POA: Diagnosis not present

## 2022-05-27 ENCOUNTER — Encounter: Payer: BC Managed Care – PPO | Admitting: Nurse Practitioner

## 2022-06-20 DIAGNOSIS — H01025 Squamous blepharitis left lower eyelid: Secondary | ICD-10-CM | POA: Diagnosis not present

## 2022-06-20 DIAGNOSIS — H18512 Endothelial corneal dystrophy, left eye: Secondary | ICD-10-CM | POA: Diagnosis not present

## 2022-06-20 DIAGNOSIS — H01022 Squamous blepharitis right lower eyelid: Secondary | ICD-10-CM | POA: Diagnosis not present

## 2022-06-20 DIAGNOSIS — H04123 Dry eye syndrome of bilateral lacrimal glands: Secondary | ICD-10-CM | POA: Diagnosis not present

## 2022-06-23 ENCOUNTER — Other Ambulatory Visit: Payer: Self-pay | Admitting: Nurse Practitioner

## 2022-06-23 DIAGNOSIS — H0102A Squamous blepharitis right eye, upper and lower eyelids: Secondary | ICD-10-CM | POA: Diagnosis not present

## 2022-06-23 DIAGNOSIS — H11223 Conjunctival granuloma, bilateral: Secondary | ICD-10-CM | POA: Diagnosis not present

## 2022-06-23 DIAGNOSIS — H0102B Squamous blepharitis left eye, upper and lower eyelids: Secondary | ICD-10-CM | POA: Diagnosis not present

## 2022-06-24 NOTE — Telephone Encounter (Signed)
Dc'd 04/16/22 Pt preference [place]  Requested Prescriptions  Refused Prescriptions Disp Refills   finasteride (PROSCAR) 5 MG tablet [Pharmacy Med Name: FINASTERIDE 5 MG TABLET] 90 tablet 4    Sig: TAKE 1 TABLET (5 MG TOTAL) BY MOUTH DAILY.     Urology: 5-alpha Reductase Inhibitors Failed - 06/23/2022  1:50 PM      Failed - PSA in normal range and within 360 days    No results found for: "LABPSA", "PSA", "PSA1", "ULTRAPSA"       Passed - Valid encounter within last 12 months    Recent Outpatient Visits           2 months ago Myotonic dystrophy, type 1 ([place])   [place], Henrine Screws T, NP   2 months ago Scalp laceration, subsequent encounter   [place], PA-C   1 year ago Myotonic dystrophy, type 1 ([place])   [place], Henrine Screws T, NP   1 year ago Myotonic dystrophy, type 1 ([place])   [place], Henrine Screws T, NP   2 years ago Lumbar strain, subsequent encounter   [place], Lilia Argue, [place]       Future Appointments             In 9 months [place], Barbaraann Faster, NP [place], [facility]

## 2022-06-30 DIAGNOSIS — H0016 Chalazion left eye, unspecified eyelid: Secondary | ICD-10-CM | POA: Diagnosis not present

## 2022-06-30 DIAGNOSIS — H0013 Chalazion right eye, unspecified eyelid: Secondary | ICD-10-CM | POA: Diagnosis not present

## 2022-06-30 DIAGNOSIS — L980 Pyogenic granuloma: Secondary | ICD-10-CM | POA: Diagnosis not present

## 2022-07-03 DIAGNOSIS — M7551 Bursitis of right shoulder: Secondary | ICD-10-CM | POA: Diagnosis not present

## 2022-07-03 DIAGNOSIS — M7552 Bursitis of left shoulder: Secondary | ICD-10-CM | POA: Diagnosis not present

## 2022-07-10 DIAGNOSIS — M25511 Pain in right shoulder: Secondary | ICD-10-CM | POA: Diagnosis not present

## 2022-07-10 DIAGNOSIS — M25512 Pain in left shoulder: Secondary | ICD-10-CM | POA: Diagnosis not present

## 2022-07-14 DIAGNOSIS — H0102B Squamous blepharitis left eye, upper and lower eyelids: Secondary | ICD-10-CM | POA: Diagnosis not present

## 2022-07-14 DIAGNOSIS — H04123 Dry eye syndrome of bilateral lacrimal glands: Secondary | ICD-10-CM | POA: Diagnosis not present

## 2022-07-14 DIAGNOSIS — H0102A Squamous blepharitis right eye, upper and lower eyelids: Secondary | ICD-10-CM | POA: Diagnosis not present

## 2022-12-10 ENCOUNTER — Encounter: Payer: Self-pay | Admitting: Nurse Practitioner

## 2023-04-17 ENCOUNTER — Encounter: Payer: BC Managed Care – PPO | Admitting: Nurse Practitioner
